# Patient Record
Sex: Male | Born: 1950
Health system: Southern US, Community
[De-identification: ages and names within clinical notes are randomized; demographics above are authoritative.]

## PROBLEM LIST (undated history)

## (undated) DIAGNOSIS — L719 Rosacea, unspecified: Secondary | ICD-10-CM

## (undated) DIAGNOSIS — N529 Male erectile dysfunction, unspecified: Secondary | ICD-10-CM

## (undated) DIAGNOSIS — F3289 Other specified depressive episodes: Secondary | ICD-10-CM

## (undated) DIAGNOSIS — N318 Other neuromuscular dysfunction of bladder: Secondary | ICD-10-CM

## (undated) DIAGNOSIS — F329 Major depressive disorder, single episode, unspecified: Secondary | ICD-10-CM

## (undated) DIAGNOSIS — I10 Essential (primary) hypertension: Secondary | ICD-10-CM

## (undated) DIAGNOSIS — R300 Dysuria: Secondary | ICD-10-CM

## (undated) DIAGNOSIS — E785 Hyperlipidemia, unspecified: Secondary | ICD-10-CM

## (undated) DIAGNOSIS — M1990 Unspecified osteoarthritis, unspecified site: Secondary | ICD-10-CM

## (undated) DIAGNOSIS — L309 Dermatitis, unspecified: Secondary | ICD-10-CM

## (undated) DIAGNOSIS — IMO0002 Reserved for concepts with insufficient information to code with codable children: Secondary | ICD-10-CM

## (undated) DIAGNOSIS — G4730 Sleep apnea, unspecified: Secondary | ICD-10-CM

## (undated) HISTORY — DX: Dermatitis, unspecified: L30.9

## (undated) HISTORY — DX: Reserved for concepts with insufficient information to code with codable children: IMO0002

## (undated) HISTORY — DX: Rosacea, unspecified: L71.9

## (undated) HISTORY — DX: Dysuria: R30.0

## (undated) HISTORY — DX: Essential (primary) hypertension: I10

## (undated) HISTORY — DX: Male erectile dysfunction, unspecified: N52.9

## (undated) HISTORY — DX: Sleep apnea, unspecified: G47.30

## (undated) HISTORY — DX: Hyperlipidemia, unspecified: E78.5

## (undated) HISTORY — DX: Other specified depressive episodes: F32.89

## (undated) HISTORY — DX: Major depressive disorder, single episode, unspecified: F32.9

## (undated) HISTORY — DX: Unspecified osteoarthritis, unspecified site: M19.90

## (undated) HISTORY — DX: Other neuromuscular dysfunction of bladder: N31.8

---

## 1997-09-30 ENCOUNTER — Emergency Department (HOSPITAL_COMMUNITY): Admission: EM | Admit: 1997-09-30 | Discharge: 1997-09-30 | Payer: Self-pay | Admitting: Emergency Medicine

## 1997-10-01 ENCOUNTER — Encounter (HOSPITAL_COMMUNITY): Admission: RE | Admit: 1997-10-01 | Discharge: 1997-12-30 | Payer: Self-pay | Admitting: *Deleted

## 1997-10-15 ENCOUNTER — Inpatient Hospital Stay (HOSPITAL_COMMUNITY): Admission: EM | Admit: 1997-10-15 | Discharge: 1997-10-22 | Payer: Self-pay | Admitting: *Deleted

## 1999-10-10 ENCOUNTER — Ambulatory Visit (HOSPITAL_COMMUNITY): Admission: RE | Admit: 1999-10-10 | Discharge: 1999-10-10 | Payer: Self-pay | Admitting: Family Medicine

## 1999-10-10 ENCOUNTER — Encounter: Payer: Self-pay | Admitting: Family Medicine

## 2000-12-08 ENCOUNTER — Encounter: Admission: RE | Admit: 2000-12-08 | Discharge: 2000-12-08 | Payer: Self-pay | Admitting: Otolaryngology

## 2000-12-08 ENCOUNTER — Encounter: Payer: Self-pay | Admitting: Otolaryngology

## 2000-12-27 ENCOUNTER — Other Ambulatory Visit: Admission: RE | Admit: 2000-12-27 | Discharge: 2000-12-27 | Payer: Self-pay | Admitting: Urology

## 2001-06-27 ENCOUNTER — Other Ambulatory Visit: Admission: RE | Admit: 2001-06-27 | Discharge: 2001-06-27 | Payer: Self-pay | Admitting: Urology

## 2004-02-18 ENCOUNTER — Encounter: Payer: Self-pay | Admitting: Gastroenterology

## 2008-06-14 ENCOUNTER — Ambulatory Visit: Payer: Self-pay | Admitting: Gastroenterology

## 2008-06-14 DIAGNOSIS — R141 Gas pain: Secondary | ICD-10-CM | POA: Insufficient documentation

## 2008-06-14 DIAGNOSIS — Z860100 Personal history of colon polyps, unspecified: Secondary | ICD-10-CM | POA: Insufficient documentation

## 2008-06-14 DIAGNOSIS — R142 Eructation: Secondary | ICD-10-CM

## 2008-06-14 DIAGNOSIS — K921 Melena: Secondary | ICD-10-CM | POA: Insufficient documentation

## 2008-06-14 DIAGNOSIS — R143 Flatulence: Secondary | ICD-10-CM

## 2008-07-02 ENCOUNTER — Ambulatory Visit: Payer: Self-pay | Admitting: Gastroenterology

## 2008-07-02 ENCOUNTER — Encounter: Payer: Self-pay | Admitting: Gastroenterology

## 2008-07-04 ENCOUNTER — Encounter: Payer: Self-pay | Admitting: Gastroenterology

## 2009-04-06 ENCOUNTER — Ambulatory Visit (HOSPITAL_BASED_OUTPATIENT_CLINIC_OR_DEPARTMENT_OTHER): Admission: RE | Admit: 2009-04-06 | Discharge: 2009-04-06 | Payer: Self-pay | Admitting: Obstetrics and Gynecology

## 2009-04-28 ENCOUNTER — Ambulatory Visit: Payer: Self-pay | Admitting: Internal Medicine

## 2010-09-24 ENCOUNTER — Encounter: Payer: Self-pay | Admitting: Internal Medicine

## 2010-09-26 ENCOUNTER — Institutional Professional Consult (permissible substitution): Payer: 59 | Admitting: Internal Medicine

## 2010-09-30 ENCOUNTER — Ambulatory Visit (INDEPENDENT_AMBULATORY_CARE_PROVIDER_SITE_OTHER): Payer: 59 | Admitting: Internal Medicine

## 2010-09-30 ENCOUNTER — Encounter: Payer: Self-pay | Admitting: Internal Medicine

## 2010-09-30 VITALS — BP 108/64 | HR 57 | Ht 71.0 in | Wt 244.6 lb

## 2010-09-30 DIAGNOSIS — G4733 Obstructive sleep apnea (adult) (pediatric): Secondary | ICD-10-CM

## 2010-09-30 DIAGNOSIS — G4739 Other sleep apnea: Secondary | ICD-10-CM

## 2010-09-30 MED ORDER — ZALEPLON 10 MG PO CAPS: 10.0000 mg | ORAL_CAPSULE | Freq: Every day | ORAL | Status: AC

## 2010-09-30 NOTE — Progress Notes (Signed)
  Subjective:    Patient ID: Daniel Frank, male    DOB: 05-15-1951, 60 y.o.   MRN: [id number]  HPI 75 yoM self referred for sleep complaints. He has a long-standing adult pattern of difficulty maintianing sleep, but falls asleep ok. He wakes every hour or two, sometimes for bathroom, but usually not clear why. Nocturia x 2-3. Urologist "irritable bladder". Disliked his only try with unknown sleep med. NPSG 04/06/09 showed severe obstructive and central sleep apnea, AHI 79.8/ hr. CPAP titration to 19 cwp left residual central apneas at 12.9/hr. He weighed 260 lbs at that time. Initial CPAP at 19 was intolerably high. Changed to autoPAP 6-19. Using a comfortable full face mask and dieted off 22 lbs. He isn't sleeping through the night much better than before. Lives with mother- not told of snore through. Not aware of movement disturbance or other discomforts. No naps, little caffeine. Review of Systems See HPI Constitutional:   No weight loss, night sweats,  Fevers, chills, fatigue,HEENT:   No headaches,  Difficulty swallowing,  Tooth/dental problems,  Sore throat,                No sneezing, itching, ear ache, nasal congestion, post nasal drip,   CV:  No chest pain, orthopnea, PND, swelling in lower extremities, anasarca, dizziness, palpitations  GI  No heartburn, indigestion, abdominal pain, nausea, vomiting, diarrhea, change in bowel habits, loss of appetite  Resp: No shortness of breath with exertion or at rest.  No excess mucus, no productive cough,  No non-productive cough,  No coughing up of blood.  No change in color of mucus.  No wheezing.  Skin: no rash or lesions.  GU: no dysuria, change in color of urine, no urgency or frequency.  No flank pain.  MS:  No joint pain or swelling.  No decreased range of motion.  No back pain.  Psych:  No change in mood or affect. No depression or anxiety.  No memory loss.      Objective:   Physical Exam General- Alert, Oriented, Affect-appropriate,  Distress- none acute   overweight  Skin- rash-none, lesions- none, excoriation- none    dry  Lymphadenopathy- none  Head- atraumatic  Eyes- Gross vision intact, PERRLA, conjunctivae clear, secretions  Ears- Normal- Hearing, canals, Tm L ,   R ,  Nose- Clear, No- Septal dev, mucus, polyps, erosion, perforation   Throat- Mallampati III , mucosa clear , drainage- none, tonsils- atrophic  Neck- flexible , trachea midline, no stridor , thyroid nl, carotid no bruit  Chest - symmetrical excursion , unlabored     Heart/CV- RRR , no murmur , no gallop  , no rub, nl s1 s2                     - JVD- none , edema- none, stasis changes- none, varices- none     Lung- clear to P&A, wheeze- none, cough- none , dullness-none, rub- none     Chest wall- Abd- tender-no, distended-no, bowel sounds-present, HSM- no  Br/ Gen/ Rectal- Not done, not indicated  Extrem- cyanosis- none, clubbing, none, atrophy- none, strength- nl  Neuro- grossly intact to observation         Assessment & Plan:

## 2010-09-30 NOTE — Patient Instructions (Signed)
Order- [facility]- autotitrate 5-20 cwp x 2 weeks for pressure check.    Try script generic Remus Loffler during the time the autotitration is being done.

## 2010-10-02 ENCOUNTER — Encounter: Payer: Self-pay | Admitting: Internal Medicine

## 2010-10-02 DIAGNOSIS — G4739 Other sleep apnea: Secondary | ICD-10-CM | POA: Insufficient documentation

## 2010-10-02 NOTE — Assessment & Plan Note (Signed)
Residual waking may reflect the central apnea component. CSA can be harder to treat, not responsive to CPAP, and possibly due to impaired central nervous system response to changing cerebral oxygenation/ slowed feedback loop. We will get a new CPAP download with his autopap for pressure and control check. Meanwhile he can try to consolidate sleep with ambien.

## 2010-10-13 ENCOUNTER — Encounter: Payer: Self-pay | Admitting: Internal Medicine

## 2010-11-06 ENCOUNTER — Ambulatory Visit (INDEPENDENT_AMBULATORY_CARE_PROVIDER_SITE_OTHER): Payer: 59 | Admitting: Internal Medicine

## 2010-11-06 ENCOUNTER — Encounter: Payer: Self-pay | Admitting: Internal Medicine

## 2010-11-06 VITALS — BP 118/78 | HR 71 | Ht 71.0 in | Wt 245.0 lb

## 2010-11-06 DIAGNOSIS — G4739 Other sleep apnea: Secondary | ICD-10-CM

## 2010-11-06 NOTE — Progress Notes (Signed)
Subjective:    Patient ID: Daniel Frank, male    DOB: 1951/03/19, 60 y.o.   MRN: [id number]  HPI  Subjective:    Patient ID: Daniel Frank, male    DOB: 20-Nov-1950, 60 y.o.   MRN: [id number]  HPI 09/30/10- 50 yoM self referred for sleep complaints. He has a long-standing adult pattern of difficulty maintianing sleep, but falls asleep ok. He wakes every hour or two, sometimes for bathroom, but usually not clear why. Nocturia x 2-3. Urologist "irritable bladder". Disliked his only try with unknown sleep med. NPSG 04/06/09 showed severe obstructive and central sleep apnea, AHI 79.8/ hr. CPAP titration to 19 cwp left residual central apneas at 12.9/hr. He weighed 260 lbs at that time. Initial CPAP at 19 was intolerably high. Changed to autoPAP 6-19. Using a comfortable full face mask and dieted off 22 lbs. He isn't sleeping through the night much better than before. Lives with mother- not told of snore through. Not aware of movement disturbance or other discomforts. No naps, little caffeine.  11/06/10- OSA , nocturia/ irritable bladder per Urology Download indicates low compliance, because he gets tired of taking it off over and over for bathroom trips. We will try once more with fixed pressure. Ambien gave headache. We will try lunesta samples. Ultimately, success with CPAP will be controlled by ability of Urology to manage his irritable bladder. We discussed oral appliances as alternative to CPAP.   Review of Systems See HPI Constitutional:   No weight loss, night sweats,  Fevers, chills, fatigue,HEENT:   No headaches,  Difficulty swallowing,  Tooth/dental problems,  Sore throat,                No sneezing, itching, ear ache, nasal congestion, post nasal drip,   CV:  No chest pain, orthopnea, PND, swelling in lower extremities, anasarca, dizziness, palpitations  GI  No heartburn, indigestion, abdominal pain, nausea, vomiting, diarrhea, change in bowel habits, loss of appetite  Resp: No  shortness of breath with exertion or at rest.  No excess mucus, no productive cough,  No non-productive cough,  No coughing up of blood.  No change in color of mucus.  No wheezing.  Skin: no rash or lesions.  GU: no dysuria, change in color of urine,.  No flank pain.  MS:  No joint pain or swelling.  No decreased range of motion.  No back pain.  Psych:  No change in mood or affect. No depression or anxiety.  No memory loss.      Objective:   Physical Exam General- Alert, Oriented, Affect-appropriate, Distress- none acute.   overweight  Skin- rash-none, lesions- none, excoriation- none    dry  Lymphadenopathy- none  Head- atraumatic  Eyes- Gross vision intact, PERRLA, conjunctivae clear, secretions  Ears- Normal- Hearing, canals, Tm -  normal  Nose- Clear, No- Septal dev, mucus, polyps, erosion, perforation   Throat- Mallampati III , mucosa clear , drainage- none, tonsils- atrophic  Neck- flexible , trachea midline, no stridor , thyroid nl, carotid no bruit  Chest - symmetrical excursion , unlabored     Heart/CV- RRR , no murmur , no gallop  , no rub, nl s1 s2                     - JVD- none , edema- none, stasis changes- none, varices- none     Lung- clear to P&A, wheeze- none, cough- none , dullness-none, rub- none     Chest  wall- Abd- tender-no, distended-no, bowel sounds-present, HSM- no  Br/ Gen/ Rectal- Not done, not indicated  Extrem- cyanosis- none, clubbing, none, atrophy- none, strength- nl  Neuro- grossly intact to observation         Assessment & Plan:     Review of Systems     Objective:   Physical Exam        Assessment & Plan:

## 2010-11-06 NOTE — Assessment & Plan Note (Addendum)
Need to get better compliance and control. If his medical bladder problem remains disruptive, then we will try an oral appliance next.  I will set him at 11 cwp fixed CPAP through [facility], and switch from [place] which gave headache, to try Lunesta samples.

## 2010-11-06 NOTE — Patient Instructions (Signed)
Replace ambien with samples Lunesta 2 mg- 1 at bedtime for sleep as needed. Call for script if helpful.  [facility] to change CPAP to fixed 11 cwp                          Our CPAP goal is all night, every night.       Try disconnecting at the hose when you get up

## 2010-11-07 ENCOUNTER — Telehealth: Payer: Self-pay | Admitting: Internal Medicine

## 2010-11-07 NOTE — Telephone Encounter (Signed)
[email or web address]  With pt's mother at the [facility] number. Pt had already left work for the day.

## 2010-11-09 ENCOUNTER — Encounter: Payer: Self-pay | Admitting: Internal Medicine

## 2010-11-10 NOTE — Telephone Encounter (Signed)
Spoke with patient-states he was wondering if another Download was needed since CPAP pressure was increased; Pt aware no download needed but to keep me posted how pressure works for him and keep 6 week follow up visit with CY.

## 2010-12-18 ENCOUNTER — Encounter: Payer: Self-pay | Admitting: Internal Medicine

## 2010-12-18 ENCOUNTER — Ambulatory Visit (INDEPENDENT_AMBULATORY_CARE_PROVIDER_SITE_OTHER): Payer: 59 | Admitting: Internal Medicine

## 2010-12-18 VITALS — BP 100/64 | HR 65 | Ht 71.0 in | Wt 245.6 lb

## 2010-12-18 DIAGNOSIS — R351 Nocturia: Secondary | ICD-10-CM

## 2010-12-18 DIAGNOSIS — G4739 Other sleep apnea: Secondary | ICD-10-CM

## 2010-12-18 MED ORDER — TEMAZEPAM 15 MG PO CAPS: ORAL_CAPSULE | ORAL | Status: DC

## 2010-12-18 NOTE — Patient Instructions (Signed)
Ask  Christoper Allegra how to adjust the "Ramp" feature on your CPAP  Script- try temazepam for sleep- script

## 2010-12-18 NOTE — Progress Notes (Signed)
Subjective:    Patient ID: Daniel Frank, male    DOB: 1950-10-26, 60 y.o.   MRN: [id number]  HPI    Review of Systems     Objective:   Physical Exam        Assessment & Plan:   Subjective:    Patient ID: Daniel Frank, male    DOB: 10/05/1950, 60 y.o.   MRN: [id number]  HPI  Subjective:    Patient ID: Daniel Frank, male    DOB: Feb 02, 1951, 60 y.o.   MRN: [id number]  HPI 09/30/10- 16 yoM self referred for sleep complaints. He has a long-standing adult pattern of difficulty maintianing sleep, but falls asleep ok. He wakes every hour or two, sometimes for bathroom, but usually not clear why. Nocturia x 2-3. Urologist "irritable bladder". Disliked his only try with unknown sleep med. NPSG 04/06/09 showed severe obstructive and central sleep apnea, AHI 79.8/ hr. CPAP titration to 19 cwp left residual central apneas at 12.9/hr. He weighed 260 lbs at that time. Initial CPAP at 19 was intolerably high. Changed to autoPAP 6-19. Using a comfortable full face mask and dieted off 22 lbs. He isn't sleeping through the night much better than before. Lives with mother- not told of snore through. Not aware of movement disturbance or other discomforts. No naps, little caffeine.  11/06/10- OSA , nocturia/ irritable bladder per Urology Download indicates low compliance, because he gets tired of taking it off over and over for bathroom trips. We will try once more with fixed pressure. Ambien gave headache. We will try lunesta samples. Ultimately, success with CPAP will be controlled by ability of Urology to manage his irritable bladder. We discussed oral appliances as alternative to CPAP.   12/18/10- OSA , nocturia/ irritable bladder per Urology We had set him on CPAP 11. He is using it less. We replaced ambien with Lunesta which lasts too long. Nocturia is a chronic problem that he continues to work on with his urologist, doing exercises. He can sleep 2 hours w/o mask before needing to get up to  urinate, and feels he sleeps as long and as well w/o CPAP. When he wakes he occasionally turns his TV on.   Review of Systems See HPI Constitutional:   No weight loss, night sweats,  Fevers, chills, fatigue,HEENT:   No headaches,  Difficulty swallowing,  Tooth/dental problems,  Sore throat,                No sneezing, itching, ear ache, nasal congestion, post nasal drip,   CV:  No chest pain, orthopnea, PND, swelling in lower extremities, anasarca, dizziness, palpitations  GI  No heartburn, indigestion, abdominal pain, nausea, vomiting, diarrhea, change in bowel habits, loss of appetite  Resp: No shortness of breath with exertion or at rest.  No excess mucus, no productive cough,  No non-productive cough,  No coughing up of blood.  No change in color of mucus.  No wheezing.  Skin: no rash or lesions.  GU: no dysuria, change in color of urine,.  No flank pain.  MS:  No joint pain or swelling.  No decreased range of motion.  No back pain.  Psych:  No change in mood or affect. No depression or anxiety.  No memory loss.      Objective:   Physical Exam General- Alert, Oriented, Affect-appropriate, Distress- none acute.   overweight  Skin- rash-none, lesions- none, excoriation- none    dry  Lymphadenopathy- none  Head- atraumatic  Eyes-  Gross vision intact, PERRLA, conjunctivae clear, secretions  Ears- Normal- Hearing, canals, Tm -  normal  Nose- Clear, No- Septal dev, mucus, polyps, erosion, perforation   Throat- Mallampati III , mucosa clear , drainage- none, tonsils- atrophic  Neck- flexible , trachea midline, no stridor , thyroid nl, carotid no bruit  Chest - symmetrical excursion , unlabored     Heart/CV- RRR , no murmur , no gallop  , no rub, nl s1 s2                     - JVD- none , edema- none, stasis changes- none, varices- none     Lung- clear to P&A, wheeze- none, cough- none , dullness-none, rub- none     Chest wall- Abd- tender-no, distended-no, bowel  sounds-present, HSM- no  Br/ Gen/ Rectal- Not done, not indicated  Extrem- cyanosis- none, clubbing, none, atrophy- none, strength- nl  Neuro- grossly intact to observation         Assessment & Plan:     Review of Systems     Objective:   Physical Exam        Assessment & Plan:

## 2010-12-18 NOTE — Assessment & Plan Note (Addendum)
Pressure of 11 seems right. He needs help with the ramp feature and will call his DME. Sleep hygeine and TV habit were discussed. His nocturia is a problem and I told him that we may end up steering him toward an oral appliance so that he doesn't have to keep connecting and disconnecting.

## 2010-12-21 DIAGNOSIS — R351 Nocturia: Secondary | ICD-10-CM | POA: Insufficient documentation

## 2011-02-17 ENCOUNTER — Encounter: Payer: Self-pay | Admitting: Internal Medicine

## 2011-02-17 ENCOUNTER — Ambulatory Visit (INDEPENDENT_AMBULATORY_CARE_PROVIDER_SITE_OTHER): Payer: 59 | Admitting: Internal Medicine

## 2011-02-17 VITALS — BP 128/76 | HR 63 | Ht 71.0 in | Wt 240.8 lb

## 2011-02-17 DIAGNOSIS — G4739 Other sleep apnea: Secondary | ICD-10-CM

## 2011-02-17 MED ORDER — ZALEPLON 10 MG PO CAPS: 10.0000 mg | ORAL_CAPSULE | Freq: Every day | ORAL | Status: AC

## 2011-02-17 NOTE — Patient Instructions (Signed)
Order- [facility] referral to Dr Althea Grimmer for consideration of oral appliance for Sleep Apnea  Script to try sonata for sleep

## 2011-02-17 NOTE — Progress Notes (Signed)
Subjective:    Patient ID: Daniel Frank, male    DOB: [birth year], 60 y.o.   MRN: [id number]  HPI    Review of Systems     Objective:   Physical Exam        Assessment & Plan:   Subjective:    Patient ID: Daniel Frank, male    DOB: 1950/10/27, 60 y.o.   MRN: [id number]  HPI    Review of Systems     Objective:   Physical Exam        Assessment & Plan:   Subjective:    Patient ID: Daniel Frank, male    DOB: Sep 21, 1950, 60 y.o.   MRN: [id number]  HPI  Subjective:    Patient ID: Daniel Frank, male    DOB: 1950/08/23, 60 y.o.   MRN: [id number]  HPI 09/30/10- 38 yoM self referred for sleep complaints. He has a long-standing adult pattern of difficulty maintianing sleep, but falls asleep ok. He wakes every hour or two, sometimes for bathroom, but usually not clear why. Nocturia x 2-3. Urologist "irritable bladder". Disliked his only try with unknown sleep med. NPSG 04/06/09 showed severe obstructive and central sleep apnea, AHI 79.8/ hr. CPAP titration to 19 cwp left residual central apneas at 12.9/hr. He weighed 260 lbs at that time. Initial CPAP at 19 was intolerably high. Changed to autoPAP 6-19. Using a comfortable full face mask and dieted off 22 lbs. He isn't sleeping through the night much better than before. Lives with mother- not told of snore through. Not aware of movement disturbance or other discomforts. No naps, little caffeine.  11/06/10- OSA , nocturia/ irritable bladder per Urology Download indicates low compliance, because he gets tired of taking it off over and over for bathroom trips. We will try once more with fixed pressure. Ambien gave headache. We will try lunesta samples. Ultimately, success with CPAP will be controlled by ability of Urology to manage his irritable bladder. We discussed oral appliances as alternative to CPAP.   12/18/10- OSA , nocturia/ irritable bladder per Urology We had set him on CPAP 11. He is using it less. We replaced  ambien with Lunesta which lasts too long. Nocturia is a chronic problem that he continues to work on with his urologist, doing exercises. He can sleep 2 hours w/o mask before needing to get up to urinate, and feels he sleeps as long and as well w/o CPAP. When he wakes he occasionally turns his TV on.   02/17/11- 59 yoM followed for Insomnia with OSA , complicated by  nocturia/ irritable bladder per Urology Temazepam helped but on 3 trials gave headache. Still wakes every hour or so for bladder- disturbing CPAP.He admits he needs to do a better job of practicing his pelvic floor muscle exercises.  New family doctor Dr Modesto Charon advised weight loss. Nasal airway tends to be stuffy- nasal strips help some. He just finds CPAP hard to put up with, especially when he is up and down so much.  We discussed oral appliance therapy as an option.  Review of Systems See HPI Constitutional:   No weight loss, night sweats,  Fevers, chills, fatigue,HEENT:   No headaches,  Difficulty swallowing,  Tooth/dental problems,  Sore throat,                No sneezing, itching, ear ache, nasal congestion, post nasal drip,  CV:  No chest pain, orthopnea, PND, swelling in lower extremities, anasarca, dizziness, palpitations  GI  No heartburn, indigestion, abdominal pain, nausea, vomiting, diarrhea, change in bowel habits, loss of appetite Resp: No shortness of breath with exertion or at rest.  No excess mucus, no productive cough,  No non-productive cough,  No coughing up of blood.  No change in color of mucus.  No wheezing.  Skin: no rash or lesions. GU: no dysuria, change in color of urine,.  No flank pain.  + chronic urinary urgency MS:  No joint pain or swelling.  No decreased range of motion.  No back pain. Psych:  No change in mood or affect. No depression or anxiety.  No memory loss.      Objective:   Physical Exam  General- Alert, Oriented, Affect-appropriate, Distress- none acute; overweight Skin- rash-none,  lesions- none, excoriation- none Lymphadenopathy- none Head- atraumatic            Eyes- Gross vision intact, PERRLA, conjunctivae clear secretions            Ears- Hearing, canalsnormal            Nose- Clear, No-Septal dev, mucus, polyps, erosion, perforation             Throat- Mallampati III , mucosa clear , drainage- none, tonsils- atrophic Neck- flexible , trachea midline, no stridor , thyroid nl, carotid no bruit Chest - symmetrical excursion , unlabored           Heart/CV- RRR , no murmur , no gallop  , no rub, nl s1 s2                           - JVD- none , edema- none, stasis changes- none, varices- none           Lung- clear to P&A, wheeze- none, cough- none , dullness-none, rub- none           Chest wall-  Abd- tender-no, distended-no, bowel sounds-present, HSM- no Br/ Gen/ Rectal- Not done, not indicated Extrem- cyanosis- none, clubbing, none, atrophy- none, strength- nl Neuro- grossly intact to observation           Assessment & Plan:     Review of Systems     Objective:   Physical Exam        Assessment & Plan:

## 2011-02-17 NOTE — Assessment & Plan Note (Addendum)
He has had difficulty maintaining compliance with CPAP 11/ Advanced, because bladder discomfort requires repeated trips to bathroom during the night. He would sleep better if his bladder woke him less. Weight loss and resumption of bladder exercises would help. He has being trying CPAP but never adapted well.  Plan refer for consideration of oral appliance.

## 2011-04-24 ENCOUNTER — Encounter (INDEPENDENT_AMBULATORY_CARE_PROVIDER_SITE_OTHER): Payer: Self-pay | Admitting: General Surgery

## 2011-04-24 ENCOUNTER — Ambulatory Visit (INDEPENDENT_AMBULATORY_CARE_PROVIDER_SITE_OTHER): Payer: 59 | Admitting: General Surgery

## 2011-04-24 VITALS — BP 128/76 | HR 68 | Temp 97.4°F | Resp 16 | Ht 71.0 in | Wt 239.6 lb

## 2011-04-24 DIAGNOSIS — E669 Obesity, unspecified: Secondary | ICD-10-CM

## 2011-04-24 DIAGNOSIS — N62 Hypertrophy of breast: Secondary | ICD-10-CM

## 2011-04-24 NOTE — Progress Notes (Signed)
Subjective:     Patient ID: Daniel Frank, male   DOB: [birth year], 60 y.o.   MRN: [id number]  HPIMr. Daniel Frank was referred to Dr. Gladis Riffle for slight thickening in the right breast area that was even a lipoma or possibly a little area of gynecomastia. Patient states he noticed area when he was showering he used to weigh about 265 pounds and he has lost approximately 240 and he is still markedly overweight on findings of this area Dr. Jacqulyn Bath referred him for a mammogram that was done in each but it was read by Alva Garnet K. pull up the actual images but she's not see any findings suspicious for malignancy and consider this possibly a little lipoma or possibly a little fatty lobule of breast tissue on physical exam when you examine him he's got very minimal gynecomastia both the left and right but is not really symptomatic is not tender at the nipples and I did review his various medications he is on some antihypertensive medication and antidepressants but none although more reported to be associated with gynecomastia but I can identify   Review of SystemsHistory reviewed. No pertinent past surgical history. No Known Allergies Current Outpatient Prescriptions  Medication Sig Dispense Refill  . Cholecalciferol (VITAMIN D) 2000 UNITS CAPS Take 1 capsule by mouth daily.        Marland Kitchen LOVAZA 1 G capsule Take 1 capsule by mouth 2 (two) times daily.       . Multiple Vitamin (MULTIVITAMIN) tablet Take 1 tablet by mouth daily.        . niacin-simvastatin (SIMCOR) 1000-20 MG 24 hr tablet Take 1 tablet by mouth at bedtime.        Marland Kitchen olmesartan (BENICAR) 40 MG tablet Take 40 mg by mouth daily.        . paroxetine mesylate (PEXEVA) 10 MG tablet Take 10 mg by mouth every morning.              Objective:   Physical ExamBP 128/76  Pulse 68  Temp(Src) 97.4 F (36.3 C) (Temporal)  Resp 16  Ht 5\' 11"  (1.803 m)  Wt 239 lb 9.6 oz (108.682 kg)  BMI 33.42 kg/m2  Patient is a large individual and when a stent in his data  obvious normal breast to assure than thin individual would have but is not the obvious thickening significant gynecomastia he doesn't report that his nipples are sensitive on physical exam when lying flat arms height you can feel a little breast is sure about probably to 3 cm total size but there is nothing that feels worrisome for malignancy or an obvious lipoma fever I don't appreciate any axillary lymphadenopathy the left or right and I did reassure him that I would just continue trying to lose weight he has lost 25 times N. not be too concerned about this unless the area that he had noted increases in size there was shortening no radiological suspicion on the mammogram but he had reviewed with him to the hospital and taken through a computer that can pull it up on. The patient is aware of the area who first noticed an insidious increase in size so that's a nickel quarter-sized I. he could return to be recently and a one of my partners I would not recommend doing anything at this time since the area of question as very small    Assessment:    Continue trying to lose weight and at the area of that he is observed is becoming  larger he can return to see one of my partners with a negative mammogram and such a minimal final physical exam at this time I would not recommend any plan of excision.     Plan:     See note above

## 2011-05-28 ENCOUNTER — Ambulatory Visit (INDEPENDENT_AMBULATORY_CARE_PROVIDER_SITE_OTHER): Payer: 59 | Admitting: Internal Medicine

## 2011-05-28 ENCOUNTER — Encounter: Payer: Self-pay | Admitting: Internal Medicine

## 2011-05-28 VITALS — BP 110/64 | HR 77 | Ht 71.0 in | Wt 240.2 lb

## 2011-05-28 DIAGNOSIS — G4739 Other sleep apnea: Secondary | ICD-10-CM

## 2011-05-28 DIAGNOSIS — Z23 Encounter for immunization: Secondary | ICD-10-CM

## 2011-05-28 NOTE — Patient Instructions (Signed)
I am glad things are doing better with CPAP. Please let me know if something needs attention.

## 2011-05-28 NOTE — Progress Notes (Signed)
Patient ID: Daniel Frank, male    DOB: 06-23-1950, 60 y.o.   MRN: [id number]  HPI 09/30/10- 89 yoM self referred for sleep complaints. He has a long-standing adult pattern of difficulty maintianing sleep, but falls asleep ok. He wakes every hour or two, sometimes for bathroom, but usually not clear why. Nocturia x 2-3. Urologist "irritable bladder". Disliked his only try with unknown sleep med. NPSG 04/06/09 showed severe obstructive and central sleep apnea, AHI 79.8/ hr. CPAP titration to 19 cwp left residual central apneas at 12.9/hr. He weighed 260 lbs at that time. Initial CPAP at 19 was intolerably high. Changed to autoPAP 6-19. Using a comfortable full face mask and dieted off 22 lbs. He isn't sleeping through the night much better than before. Lives with mother- not told of snore through. Not aware of movement disturbance or other discomforts. No naps, little caffeine.  11/06/10- OSA , nocturia/ irritable bladder per Urology Download indicates low compliance, because he gets tired of taking it off over and over for bathroom trips. We will try once more with fixed pressure. Ambien gave headache. We will try lunesta samples. Ultimately, success with CPAP will be controlled by ability of Urology to manage his irritable bladder. We discussed oral appliances as alternative to CPAP.   12/18/10- OSA , nocturia/ irritable bladder per Urology We had set him on CPAP 11. He is using it less. We replaced ambien with Lunesta which lasts too long. Nocturia is a chronic problem that he continues to work on with his urologist, doing exercises. He can sleep 2 hours w/o mask before needing to get up to urinate, and feels he sleeps as long and as well w/o CPAP. When he wakes he occasionally turns his TV on.   02/17/11- 60 yoM followed for Insomnia with OSA , complicated by  nocturia/ irritable bladder per Urology Temazepam helped but on 3 trials gave headache. Still wakes every hour or so for bladder- disturbing  CPAP.He admits he needs to do a better job of practicing his pelvic floor muscle exercises.  New family doctor Dr Modesto Charon advised weight loss. Nasal airway tends to be stuffy- nasal strips help some. He just finds CPAP hard to put up with, especially when he is up and down so much.  We discussed oral appliance therapy as an option.  05/28/11- 60 yoM followed for Insomnia with OSA , complicated by  nocturia/ irritable bladder per Urology Declines flu vax. Says he has become able to tolerate CPAP better and is now using it all night every night. Using bladder/ pelvic exercises to reduce nocturia related waking. Not needing Sonata. Recent sinus congestion/ headache. Dr Modesto Charon is treating abx, sudafed,- medication talk done.  Review of Systems See HPI Constitutional:   No weight loss, night sweats,  Fevers, chills, fatigue, HEENT:   No headaches,  Difficulty swallowing,  Tooth/dental problems,  Sore throat,                No sneezing, itching, ear ache, +nasal congestion, post nasal drip,  CV:  No chest pain, orthopnea, PND, swelling in lower extremities, anasarca, dizziness, palpitations GI  No heartburn, indigestion, abdominal pain, nausea, vomiting, diarrhea, change in bowel habits, loss of appetite Resp: No shortness of breath with exertion or at rest.  No excess mucus, no productive cough,  No non-productive cough,  No coughing up of blood.  No change in color of mucus.  No wheezing.  Skin: no rash or lesions. GU: no dysuria, change in  color of urine,.  No flank pain.  + chronic urinary urgency MS:  No joint pain or swelling.  No decreased range of motion.  No back pain. Psych:  No change in mood or affect. No depression or anxiety.  No memory loss.      Objective:   Physical Exam  General- Alert, Oriented, Affect-appropriate, Distress- none acute; overweight Skin- rash-none, lesions- none, excoriation- none Lymphadenopathy- none Head- atraumatic            Eyes- Gross vision intact,  PERRLA, conjunctivae clear secretions            Ears- Hearing, canalsnormal            Nose- mild turbinate edema, No-Septal dev, mucus, polyps, erosion, perforation             Throat- Mallampati III-IV, mucosa clear , drainage- none, tonsils- atrophic Neck- flexible , trachea midline, no stridor , thyroid nl, carotid no bruit Chest - symmetrical excursion , unlabored           Heart/CV- RRR , no murmur , no gallop  , no rub, nl s1 s2                           - JVD- none , edema- none, stasis changes- none, varices- none           Lung- clear to P&A, wheeze- none, cough- none , dullness-none, rub- none           Chest wall-  Abd- tender-no, distended-no, bowel sounds-present, HSM- no Br/ Gen/ Rectal- Not done, not indicated Extrem- cyanosis- none, clubbing, none, atrophy- none, strength- nl Neuro- grossly intact to observation

## 2011-06-01 NOTE — Assessment & Plan Note (Signed)
More hopeful that he can make CPAP work. He is controlling nocturia better, meaning fewer times waking and removing mask for bathroom.

## 2011-07-02 ENCOUNTER — Encounter (INDEPENDENT_AMBULATORY_CARE_PROVIDER_SITE_OTHER): Payer: Self-pay

## 2011-07-09 ENCOUNTER — Encounter: Payer: Self-pay | Admitting: Gastroenterology

## 2011-07-28 ENCOUNTER — Encounter: Payer: Self-pay | Admitting: Gastroenterology

## 2011-07-28 ENCOUNTER — Ambulatory Visit (AMBULATORY_SURGERY_CENTER): Payer: 59 | Admitting: *Deleted

## 2011-07-28 VITALS — Ht 71.0 in | Wt 240.5 lb

## 2011-07-28 DIAGNOSIS — Z1211 Encounter for screening for malignant neoplasm of colon: Secondary | ICD-10-CM

## 2011-07-28 MED ORDER — PEG-KCL-NACL-NASULF-NA ASC-C 100 G PO SOLR: ORAL | Status: DC

## 2011-08-11 ENCOUNTER — Ambulatory Visit (AMBULATORY_SURGERY_CENTER): Payer: 59 | Admitting: Gastroenterology

## 2011-08-11 ENCOUNTER — Encounter: Payer: Self-pay | Admitting: Gastroenterology

## 2011-08-11 VITALS — BP 113/79 | HR 66 | Temp 95.0°F | Resp 19 | Ht 71.0 in | Wt 240.0 lb

## 2011-08-11 DIAGNOSIS — Z86010 Personal history of colonic polyps: Secondary | ICD-10-CM

## 2011-08-11 DIAGNOSIS — D126 Benign neoplasm of colon, unspecified: Secondary | ICD-10-CM

## 2011-08-11 DIAGNOSIS — Z1211 Encounter for screening for malignant neoplasm of colon: Secondary | ICD-10-CM

## 2011-08-11 DIAGNOSIS — D129 Benign neoplasm of anus and anal canal: Secondary | ICD-10-CM

## 2011-08-11 DIAGNOSIS — D128 Benign neoplasm of rectum: Secondary | ICD-10-CM

## 2011-08-11 LAB — HM COLONOSCOPY

## 2011-08-11 MED ORDER — SODIUM CHLORIDE 0.9 % IV SOLN: 500.0000 mL | INTRAVENOUS | Status: DC

## 2011-08-11 NOTE — Patient Instructions (Signed)

## 2011-08-11 NOTE — Progress Notes (Signed)
Patient did not have preoperative order for IV antibiotic SSI prophylaxis. (G8918)  Patient did not experience any of the following events: a burn prior to discharge; a fall within the facility; wrong site/side/patient/procedure/implant event; or a hospital transfer or hospital admission upon discharge from the facility. (G8907)  

## 2011-08-11 NOTE — Op Note (Signed)
[facility] [address] [place], [place]  [address]  COLONOSCOPY PROCEDURE REPORT  PATIENT:  Daniel, Frank  MR#:  [id number] BIRTHDATE:  [birth year], 60 yrs. old  GENDER:  male ENDOSCOPIST:  Judie Petit T. Russella Dar, MD, [facility]  PROCEDURE DATE:  08/11/2011 PROCEDURE:  Colonoscopy with biopsy ASA CLASS:  Class II INDICATIONS:  1) surveillance and high-risk screening  2) history of pre-cancerous (adenomatous) colon polyps: 2005. MEDICATIONS:   MAC sedation, administered by CRNA, propofol (Diprivan) 200 mg IV DESCRIPTION OF PROCEDURE:   After the risks benefits and alternatives of the procedure were thoroughly explained, informed consent was obtained.  Digital rectal exam was performed and revealed no abnormalities.   The LB CF-[id number] endoscope was introduced through the anus and advanced to the cecum, which was identified by both the appendix and ileocecal valve, with a tortuous colon.   The quality of the prep was adequate, using MoviPrep.  The instrument was then slowly withdrawn as the colon was fully examined. <<PROCEDUREIMAGES>> FINDINGS:  A sessile polyp was found in the ascending colon. It was 5 mm in size. The polyp was removed using cold biopsy forceps. Otherwise normal colonoscopy without other polyps, masses, vascular ectasias, or inflammatory changes. Retroflexed views in the rectum revealed no abnormalities. The time to cecum =  4.33 minutes. The scope was then withdrawn (time =  10.75  min) from the patient and the procedure completed.  COMPLICATIONS:  None  ENDOSCOPIC IMPRESSION: 1) 5 mm sessile polyp in the ascending colon  RECOMMENDATIONS: 1) Await pathology results 2) Repeat Colonoscopy in 5 years with a more extensive bowel prep  Daniel Frank T. Russella Dar, MD, Daniel Frank  CC:  Orvan July MD  n. Rosalie DoctorVenita Lick. Louna Rothgeb at 08/11/2011 09:01 AM  [place], [phone]

## 2011-08-12 ENCOUNTER — Telehealth: Payer: Self-pay | Admitting: *Deleted

## 2011-08-12 NOTE — Telephone Encounter (Signed)
Voicemail message left

## 2011-08-22 ENCOUNTER — Encounter: Payer: Self-pay | Admitting: Gastroenterology

## 2012-04-25 ENCOUNTER — Telehealth: Payer: Self-pay | Admitting: Internal Medicine

## 2012-04-25 NOTE — Telephone Encounter (Signed)
Will forward to Dr. Young as an FYI 

## 2012-04-26 NOTE — Telephone Encounter (Signed)
Noted  

## 2012-05-27 ENCOUNTER — Ambulatory Visit: Payer: 59 | Admitting: Internal Medicine

## 2012-08-25 ENCOUNTER — Other Ambulatory Visit (INDEPENDENT_AMBULATORY_CARE_PROVIDER_SITE_OTHER): Payer: 59

## 2012-08-25 DIAGNOSIS — E785 Hyperlipidemia, unspecified: Secondary | ICD-10-CM

## 2012-08-25 DIAGNOSIS — E559 Vitamin D deficiency, unspecified: Secondary | ICD-10-CM

## 2012-08-25 DIAGNOSIS — I10 Essential (primary) hypertension: Secondary | ICD-10-CM

## 2012-08-25 LAB — COMPREHENSIVE METABOLIC PANEL
ALT: 17 U/L (ref 0–53)
AST: 16 U/L (ref 0–37)
Albumin: 4 g/dL (ref 3.5–5.2)
Alkaline Phosphatase: 54 U/L (ref 39–117)
BUN: 19 mg/dL (ref 6–23)
CO2: 31 mEq/L (ref 19–32)
Calcium: 9 mg/dL (ref 8.4–10.5)
Chloride: 105 mEq/L (ref 96–112)
Creat: 1 mg/dL (ref 0.50–1.35)
Glucose, Bld: 107 mg/dL — ABNORMAL HIGH (ref 70–99)
Potassium: 5 mEq/L (ref 3.5–5.3)
Sodium: 142 mEq/L (ref 135–145)
Total Bilirubin: 0.4 mg/dL (ref 0.3–1.2)
Total Protein: 5.8 g/dL — ABNORMAL LOW (ref 6.0–8.3)

## 2012-08-25 NOTE — Progress Notes (Signed)
Patient came in for labs only.

## 2012-08-25 NOTE — Progress Notes (Signed)
Patient here for labs only. 

## 2012-08-26 LAB — NMR, LIPOPROFILE

## 2012-08-26 LAB — VITAMIN D 25 HYDROXY (VIT D DEFICIENCY, FRACTURES): Vit D, 25-Hydroxy: 36 ng/mL (ref 30–89)

## 2012-08-30 ENCOUNTER — Encounter: Payer: Self-pay | Admitting: Family Medicine

## 2012-09-02 ENCOUNTER — Ambulatory Visit (INDEPENDENT_AMBULATORY_CARE_PROVIDER_SITE_OTHER): Payer: 59

## 2012-09-02 ENCOUNTER — Encounter: Payer: Self-pay | Admitting: Family Medicine

## 2012-09-02 ENCOUNTER — Ambulatory Visit (INDEPENDENT_AMBULATORY_CARE_PROVIDER_SITE_OTHER): Payer: 59 | Admitting: Family Medicine

## 2012-09-02 VITALS — BP 113/71 | HR 69 | Temp 98.1°F | Ht 71.0 in | Wt 222.0 lb

## 2012-09-02 DIAGNOSIS — R7309 Other abnormal glucose: Secondary | ICD-10-CM

## 2012-09-02 DIAGNOSIS — R739 Hyperglycemia, unspecified: Secondary | ICD-10-CM | POA: Insufficient documentation

## 2012-09-02 DIAGNOSIS — E785 Hyperlipidemia, unspecified: Secondary | ICD-10-CM

## 2012-09-02 DIAGNOSIS — N3281 Overactive bladder: Secondary | ICD-10-CM | POA: Insufficient documentation

## 2012-09-02 DIAGNOSIS — M542 Cervicalgia: Secondary | ICD-10-CM

## 2012-09-02 DIAGNOSIS — M5137 Other intervertebral disc degeneration, lumbosacral region: Secondary | ICD-10-CM

## 2012-09-02 DIAGNOSIS — N318 Other neuromuscular dysfunction of bladder: Secondary | ICD-10-CM

## 2012-09-02 DIAGNOSIS — E669 Obesity, unspecified: Secondary | ICD-10-CM

## 2012-09-02 DIAGNOSIS — I10 Essential (primary) hypertension: Secondary | ICD-10-CM

## 2012-09-02 DIAGNOSIS — M51379 Other intervertebral disc degeneration, lumbosacral region without mention of lumbar back pain or lower extremity pain: Secondary | ICD-10-CM

## 2012-09-02 DIAGNOSIS — M5136 Other intervertebral disc degeneration, lumbar region: Secondary | ICD-10-CM | POA: Insufficient documentation

## 2012-09-02 DIAGNOSIS — M51369 Other intervertebral disc degeneration, lumbar region without mention of lumbar back pain or lower extremity pain: Secondary | ICD-10-CM | POA: Insufficient documentation

## 2012-09-02 LAB — POCT GLYCOSYLATED HEMOGLOBIN (HGB A1C): Hemoglobin A1C: 5.7

## 2012-09-02 NOTE — Assessment & Plan Note (Signed)
Dietary changes and weight reduction

## 2012-09-02 NOTE — Assessment & Plan Note (Signed)
Patient only has occasional flareup of his back pain. Controlled with over-the-counter acetaminophen. No intervention needed at this time. However recommended to patient, significant weight loss because this wouldn improve his back pain. Diet and exercise. Back care.

## 2012-09-02 NOTE — Assessment & Plan Note (Signed)
Again dietary changes and exercise and weight reduction discussed

## 2012-09-02 NOTE — Progress Notes (Signed)
Patient ID: Daniel Frank, male   DOB: [birth year], 62 y.o.   MRN: [id number] SUBJECTIVE:   HPI: Patient here for followup of his medical problems routinely. He's been doing fair well. Work stress is ongoing. New problem is on the left side of his neck is sore when he tries to turn his head and neck is stiff no neurological deficits in the upper extremities no paresthesias in the upper extremities. He does have a long history of having lumbar degenerative disc disease. In symptoms in his lower back is intermittent as well. Acetaminophen controls the pain when needed. However some intermittent. He just wanted his neck checked.  PMH/PSH:reviewed/updated in Epic SH/FH:reviewed/updated in Epic Meds:reviewed/updated in Epic Allergies:reviewed/updated in Epic Immunizations:reviewed/updated in Epic ROS: as above in HPI. Other systems are negative or stable today.  OBJECTIVE:   On examination he appeared in no acute distress. Obese waist is 46-1/2 inches Vital signs as documented. BP 113/71  Pulse 69  Temp(Src) 98.1 F (36.7 C) (Oral)  Ht 5\' 11"  (1.803 m)  Wt 222 lb (100.699 kg)  BMI 30.98 kg/m2  Skin warm and dry and without overt rashes.  Head &Neck without JVD. Decreased range of motion to 45 the right and to the left rotation of the head produces discomfort in the left side up the neck posteriorly. Flexion and extension produces mild pain. Lungs clear.  Heart exam notable for regular rhythm, normal sounds and absence of murmurs, rubs or gallops.  Abdomen unremarkable and without evidence of organomegaly, masses, or abdominal aortic enlargement.  Extremities nonedematous. Neurologic: oriented to name, place, and time. Nonfocal exam. ASSESSMENT:  Degenerative disc disease, lumbar Patient only has occasional flareup of his back pain. Controlled with over-the-counter acetaminophen. No intervention needed at this time. However recommended to patient, significant weight loss because this  wouldn improve his back pain. Diet and exercise. Back care.  Hyperglycemia Recommended significant weight loss. Dietary changes reducing carbs sugars and fats. Exercise. Plant base diet  Obesity, unspecified Dietary changes and weight reduction  Overactive bladder Follow up with the urologist  Other and unspecified hyperlipidemia Continue with present medications. His meds were reviewed. He is in NMR lipomed panel was not back yet. He is to call us for his lab results if he hasn't heard from [place] in 2 weeks  Unspecified essential hypertension Again dietary changes and exercise and weight reduction discussed  Neck pain on left side He is a new complaint of stiffness of the neck and pain in the left side of her neck especially when he rotates his head. He attributes this to degenerative disc disease/arthritis developing in the neck. He just wanted it checked out today so we did an x-ray and it showed significant degenerative disc disease and arthritic changes in the lower cervical vertebrae however his symptoms are intermittent and pain can be controlled with acetaminophen. Offered physical therapy. Patient defers at this time. He will consider it if the pain relapses and has not controlled.     PLAN:  Orders Placed This Encounter  Procedures  . MyChart Exercise Flowsheet    Order Specific Question:  After how many days would you like to receive a notification of this patient's flowsheet entries?    Answer:  63    Order Specific Question:  After how many readings would you like to receive notification of this patient's flowsheet entries?    Answer:  1  . MyChart Weight Flowsheet    Order Specific Question:  After how many days  would you like to receive a notification of this patient's flowsheet entries?    Answer:  51    Order Specific Question:  After how many readings would you like to receive notification of this patient's flowsheet entries?    Answer:  1  . DG Cervical Spine  Complete    Standing Status: Future     Number of Occurrences: 1     Standing Expiration Date: 11/02/2013    Order Specific Question:  Reason for Exam (SYMPTOM  OR DIAGNOSIS REQUIRED)    Answer:  neck pain    Order Specific Question:  Preferred imaging location?    Answer:  Internal  . POCT glycosylated hemoglobin (Hb A1C)   [facility] reading (PRIMARY) by  Dr. Leodis Sias: Significant degenerative disease of C4-5-6. Degenerative disc disease. Loss of disc space and spurring of the vertebra. Await official reading                               No orders of the defined types were placed in this encounter.   Results for orders placed in visit on 09/02/12 (from the past 24 hour(s))  POCT GLYCOSYLATED HEMOGLOBIN (HGB A1C)     Status: None   Collection Time    09/02/12  5:05 PM      Result Value Range   Hemoglobin A1C 5.7     continue present medications.  Janetta Vandoren P. Modesto Charon, M.D.

## 2012-09-02 NOTE — Assessment & Plan Note (Addendum)
Continue with present medications. His meds were reviewed. He is in NMR lipomed panel was not back yet. He is to call us for his lab results if he hasn't heard from [place] in 2 weeks

## 2012-09-02 NOTE — Patient Instructions (Addendum)
Degenerative Disk Disease Degenerative disk disease is a condition caused by the changes that occur in the cushions of the backbone (spinal disks) as you grow older. Spinal disks are soft and compressible disks located between the bones of the spine (vertebrae). They act like shock absorbers. Degenerative disk disease can affect the whole spine. However, the neck and lower back are most commonly affected. Many changes can occur in the spinal disks with aging, such as:  The spinal disks may dry and shrink.  Small tears may occur in the tough, outer covering of the disk (annulus).  The disk space may become smaller due to loss of water.  Abnormal growths in the bone (spurs) may occur. This can put pressure on the nerve roots exiting the spinal canal, causing pain.  The spinal canal may become narrowed. CAUSES  Degenerative disk disease is a condition caused by the changes that occur in the spinal disks with aging. The exact cause is not known, but there is a genetic basis for many patients. Degenerative changes can occur due to loss of fluid in the disk. This makes the disk thinner and reduces the space between the backbones. Small cracks can develop in the outer layer of the disk. This can lead to the breakdown of the disk. You are more likely to get degenerative disk disease if you are overweight. Smoking cigarettes and doing heavy work such as weightlifting can also increase your risk of this condition. Degenerative changes can start after a sudden injury. Growth of bone spurs can compress the nerve roots and cause pain.  SYMPTOMS  The symptoms vary from person to person. Some people may have no pain, while others have severe pain. The pain may be so severe that it can limit your activities. The location of the pain depends on the part of your backbone that is affected. You will have neck or arm pain if a disk in the neck area is affected. You will have pain in your back, buttocks, or legs if a disk  in the lower back is affected. The pain becomes worse while bending, reaching up, or with twisting movements. The pain may start gradually and then get worse as time passes. It may also start after a major or minor injury. You may feel numbness or tingling in the arms or legs.  DIAGNOSIS  Your caregiver will ask you about your symptoms and about activities or habits that may cause the pain. He or she may also ask about any injuries, diseases, or treatments you have had earlier. Your caregiver will examine you to check for the range of movement that is possible in the affected area, to check for strength in your extremities, and to check for sensation in the areas of the arms and legs supplied by different nerve roots. An X-ray of the spine may be taken. Your caregiver may suggest other imaging tests, such as magnetic resonance imaging (MRI), if needed.  TREATMENT  Treatment includes rest, modifying your activities, and applying ice and heat. Your caregiver may prescribe medicines to reduce your pain and may ask you to do some exercises to strengthen your back. In some cases, you may need surgery. You and your caregiver will decide on the treatment that is best for you. HOME CARE INSTRUCTIONS   Follow proper lifting and walking techniques as advised by your caregiver.  Maintain good posture.  Exercise regularly as advised.  Perform relaxation exercises.  Change your sitting, standing, and sleeping habits as advised. Change positions   frequently.  Lose weight as advised.  Stop smoking if you smoke.  Wear supportive footwear. SEEK MEDICAL CARE IF:  Your pain does not go away within 1 to 4 weeks. SEEK IMMEDIATE MEDICAL CARE IF:   Your pain is severe.  You notice weakness in your arms, hands, or legs.  You begin to lose control of your bladder or bowel movements. MAKE SURE YOU:   Understand these instructions.  Will watch your condition.  Will get help right away if you are not doing  well or get worse. Document Released: 03/22/2007 Document Revised: 08/17/2011 Document Reviewed: 03/22/2007 [facility] Patient Information 2013 [place], [place]. Degenerative Arthritis You have osteoarthritis. This is the wear and tear arthritis that comes with aging. It is also called degenerative arthritis. This is common in people past middle age. It is caused by stress on the joints. The large weight bearing joints of the lower extremities are most often affected. The knees, hips, back, neck, and hands can become painful, swollen, and stiff. This is the most common type of arthritis. It comes on with age, carrying too much weight, or from an injury. Treatment includes resting the sore joint until the pain and swelling improve. Crutches or a walker may be needed for severe flares. Only take over-the-counter or prescription medicines for pain, discomfort, or fever as directed by your caregiver. Local heat therapy may improve motion. Cortisone shots into the joint are sometimes used to reduce pain and swelling during flares. Osteoarthritis is usually not crippling and progresses slowly. There are things you can do to decrease pain:  Avoid high impact activities.  Exercise regularly.  Low impact exercises such as walking, biking and swimming help to keep the muscles strong and keep normal joint function.  Stretching helps to keep your range of motion.  Lose weight if you are overweight. This reduces joint stress. In severe cases when you have pain at rest or increasing disability, joint surgery may be helpful. See your caregiver for follow-up treatment as recommended.  SEEK IMMEDIATE MEDICAL CARE IF:   You have severe joint pain.  Marked swelling and redness in your joint develops.  You develop a high fever. Document Released: 05/25/2005 Document Revised: 08/17/2011 Document Reviewed: 10/25/2006 [facility] Patient Information 2013 [place], [place].

## 2012-09-02 NOTE — Assessment & Plan Note (Signed)
Recommended significant weight loss. Dietary changes reducing carbs sugars and fats. Exercise. Plant base diet

## 2012-09-02 NOTE — Assessment & Plan Note (Signed)
He is a new complaint of stiffness of the neck and pain in the left side of her neck especially when he rotates his head. He attributes this to degenerative disc disease/arthritis developing in the neck. He just wanted it checked out today so we did an x-ray and it showed significant degenerative disc disease and arthritic changes in the lower cervical vertebrae however his symptoms are intermittent and pain can be controlled with acetaminophen. Offered physical therapy. Patient defers at this time. He will consider it if the pain relapses and has not controlled.

## 2012-09-02 NOTE — Assessment & Plan Note (Signed)
Follow-up with the urologist °

## 2012-09-03 NOTE — Progress Notes (Signed)
Quick Note:  Call patient. Labs normal.The HGBA1C is borderline normal to early preDM. Treatment is weight loss and exercise. No change in plan. ______

## 2012-09-16 ENCOUNTER — Encounter: Payer: Self-pay | Admitting: Family Medicine

## 2012-12-26 ENCOUNTER — Other Ambulatory Visit (INDEPENDENT_AMBULATORY_CARE_PROVIDER_SITE_OTHER): Payer: 59

## 2012-12-26 DIAGNOSIS — E785 Hyperlipidemia, unspecified: Secondary | ICD-10-CM

## 2012-12-26 DIAGNOSIS — E559 Vitamin D deficiency, unspecified: Secondary | ICD-10-CM

## 2012-12-26 DIAGNOSIS — E1159 Type 2 diabetes mellitus with other circulatory complications: Secondary | ICD-10-CM

## 2012-12-26 LAB — POCT CBC
Granulocyte percent: 59.7 %G (ref 37–80)
HCT, POC: 42.2 % — AB (ref 43.5–53.7)
Hemoglobin: 14.5 g/dL (ref 14.1–18.1)
Lymph, poc: 2 (ref 0.6–3.4)
MCH, POC: 32.7 pg — AB (ref 27–31.2)
MCHC: 34.3 g/dL (ref 31.8–35.4)
MCV: 95.4 fL (ref 80–97)
MPV: 10 fL (ref 0–99.8)
POC Granulocyte: 3.4 (ref 2–6.9)
POC LYMPH PERCENT: 35.4 %L (ref 10–50)
Platelet Count, POC: 146 10*3/uL (ref 142–424)
RBC: 4.4 M/uL — AB (ref 4.69–6.13)
RDW, POC: 12.3 %
WBC: 5.7 10*3/uL (ref 4.6–10.2)

## 2012-12-26 LAB — POCT GLYCOSYLATED HEMOGLOBIN (HGB A1C): Hemoglobin A1C: 5.2

## 2012-12-27 LAB — COMPLETE METABOLIC PANEL WITH GFR
ALT: 11 U/L (ref 0–53)
AST: 15 U/L (ref 0–37)
Albumin: 4.1 g/dL (ref 3.5–5.2)
Alkaline Phosphatase: 49 U/L (ref 39–117)
BUN: 15 mg/dL (ref 6–23)
CO2: 28 mEq/L (ref 19–32)
Calcium: 9.1 mg/dL (ref 8.4–10.5)
Chloride: 104 mEq/L (ref 96–112)
Creat: 0.99 mg/dL (ref 0.50–1.35)
GFR, Est African American: >89 mL/min
GFR, Est Non African American: 83 mL/min
Glucose, Bld: 88 mg/dL (ref 70–99)
Potassium: 4.4 mEq/L (ref 3.5–5.3)
Sodium: 141 mEq/L (ref 135–145)
Total Bilirubin: 0.7 mg/dL (ref 0.3–1.2)
Total Protein: 6.3 g/dL (ref 6.0–8.3)

## 2012-12-27 LAB — NMR LIPOPROFILE WITH LIPIDS
Cholesterol, Total: 172 mg/dL (ref <200)
HDL Particle Number: 35 umol/L (ref >=30.5)
HDL Size: 8.4 nm — ABNORMAL LOW (ref >=9.2)
HDL-C: 46 mg/dL (ref >=40)
LDL (calc): 108 mg/dL — ABNORMAL HIGH (ref <100)
LDL Particle Number: 1253 nmol/L — ABNORMAL HIGH (ref <1000)
LDL Size: 21 nm (ref >20.5)
LP-IR Score: 41 (ref <=45)
Large HDL-P: 2.7 umol/L — ABNORMAL LOW (ref >=4.8)
Large VLDL-P: <0.8 nmol/L (ref <=2.7)
Small LDL Particle Number: 340 nmol/L (ref <=527)
Triglycerides: 88 mg/dL (ref <150)
VLDL Size: 39 nm (ref <=46.6)

## 2012-12-27 LAB — VITAMIN D 25 HYDROXY (VIT D DEFICIENCY, FRACTURES): Vit D, 25-Hydroxy: 43 ng/mL (ref 30–89)

## 2013-01-03 ENCOUNTER — Encounter: Payer: Self-pay | Admitting: Family Medicine

## 2013-01-03 ENCOUNTER — Ambulatory Visit (INDEPENDENT_AMBULATORY_CARE_PROVIDER_SITE_OTHER): Payer: 59 | Admitting: Family Medicine

## 2013-01-03 VITALS — BP 127/75 | HR 76 | Temp 97.7°F | Wt 217.6 lb

## 2013-01-03 DIAGNOSIS — M5136 Other intervertebral disc degeneration, lumbar region: Secondary | ICD-10-CM

## 2013-01-03 DIAGNOSIS — N318 Other neuromuscular dysfunction of bladder: Secondary | ICD-10-CM

## 2013-01-03 DIAGNOSIS — E785 Hyperlipidemia, unspecified: Secondary | ICD-10-CM | POA: Insufficient documentation

## 2013-01-03 DIAGNOSIS — N3281 Overactive bladder: Secondary | ICD-10-CM

## 2013-01-03 DIAGNOSIS — R209 Unspecified disturbances of skin sensation: Secondary | ICD-10-CM

## 2013-01-03 DIAGNOSIS — R202 Paresthesia of skin: Secondary | ICD-10-CM | POA: Insufficient documentation

## 2013-01-03 DIAGNOSIS — E669 Obesity, unspecified: Secondary | ICD-10-CM

## 2013-01-03 DIAGNOSIS — I10 Essential (primary) hypertension: Secondary | ICD-10-CM

## 2013-01-03 DIAGNOSIS — M5137 Other intervertebral disc degeneration, lumbosacral region: Secondary | ICD-10-CM

## 2013-01-03 DIAGNOSIS — M51369 Other intervertebral disc degeneration, lumbar region without mention of lumbar back pain or lower extremity pain: Secondary | ICD-10-CM

## 2013-01-03 NOTE — Patient Instructions (Signed)
      Dr Alexios Keown's Recommendations  Diet and Exercise discussed with patient.  For nutrition information, I recommend books:  1).Eat to Live by Dr Joel Fuhrman. 2).Prevent and Reverse Heart Disease by Dr Caldwell Esselstyn. 3) Dr Neal Barnard's Book:  Program to Reverse Diabetes  Exercise recommendations are:  If unable to walk, then the patient can exercise in a chair 3 times a day. By flapping arms like a bird gently and raising legs outwards to the front.  If ambulatory, the patient can go for walks for 30 minutes 3 times a week. Then increase the intensity and duration as tolerated.  Goal is to try to attain exercise frequency to 5 times a week.  If applicable: Best to perform resistance exercises (machines or weights) 2 days a week and cardio type exercises 3 days per week.  

## 2013-01-03 NOTE — Progress Notes (Signed)
Patient ID: Daniel Frank, male   DOB: 06/12/1950, 62 y.o.   MRN: [id number] SUBJECTIVE: CC: Chief Complaint  Patient presents with  . Follow-up    c/o burning feet,toes and hands  c/o plantar fascitis   HPI: Patient is here for follow up of hypertension: denies Headache;deniesChest Pain;denies weakness;denies Shortness of Breath or Orthopnea;denies Visual changes;denies palpitations;denies cough;denies pedal edema;denies symptoms of TIA or stroke; admits to Compliance with medications. denies Problems with medications.  Burning in the tips of the toes and hands and feet.  Past Medical History  Diagnosis Date  . Arthritis   . Depressive disorder, not elsewhere classified   . Other and unspecified hyperlipidemia   . Unspecified essential hypertension   . Sleep apnea     cpap  . Spastic dysuria   . Hyperactivity of bladder   . Organic impotence    No past surgical history on file. History   Social History  . Marital Status: Single    Spouse Name: N/A    Number of Children: N/A  . Years of Education: N/A   Occupational History  . Mapping supervisor    Social History Main Topics  . Smoking status: Former Smoker    Types: Cigarettes    Quit date: 07/27/2006  . Smokeless tobacco: Never Used  . Alcohol Use: 1.2 oz/week    2 Cans of beer per week  . Drug Use: No  . Sexually Active: Not on file   Other Topics Concern  . Not on file   Social History Narrative  . No narrative on file   Family History  Problem Relation Age of Onset  . Uterine cancer Sister   . Cancer Sister     uterine  . Heart disease Father   . Colon cancer Neg Hx   . Stomach cancer Neg Hx    Current Outpatient Prescriptions on File Prior to Visit  Medication Sig Dispense Refill  . aspirin 81 MG tablet Take 81 mg by mouth daily.        . Cholecalciferol (VITAMIN D) 2000 UNITS CAPS Take 1 capsule by mouth daily.        Marland Kitchen LOVAZA 1 G capsule Take 1 capsule by mouth 2 (two) times daily.       Marland Kitchen  olmesartan (BENICAR) 40 MG tablet Take 40 mg by mouth daily.        . niacin-simvastatin (SIMCOR) 1000-20 MG 24 hr tablet Take 1 tablet by mouth at bedtime.         No current facility-administered medications on file prior to visit.   No Known Allergies Immunization History  Administered Date(s) Administered  . Influenza Split 05/28/2011  . Tdap 08/07/2006   Prior to Admission medications   Medication Sig Start Date End Date Taking? Authorizing Provider  aspirin 81 MG tablet Take 81 mg by mouth daily.     Yes Historical Provider, MD  Cholecalciferol (VITAMIN D) 2000 UNITS CAPS Take 1 capsule by mouth daily.     Yes Historical Provider, MD  LOVAZA 1 G capsule Take 1 capsule by mouth 2 (two) times daily.  10/30/10  Yes Historical Provider, MD  olmesartan (BENICAR) 40 MG tablet Take 40 mg by mouth daily.     Yes Historical Provider, MD  niacin-simvastatin [facility]) 1000-20 MG 24 hr tablet Take 1 tablet by mouth at bedtime.      Historical Provider, MD    ROS: As above in the HPI. All other systems are stable or negative.  OBJECTIVE: APPEARANCE:  Patient in no acute distress.The patient appeared well nourished and normally developed. Acyanotic. Waist: VITAL SIGNS:BP 127/75  Pulse 76  Temp(Src) 97.7 F (36.5 C) (Oral)  Wt 217 lb 9.6 oz (98.703 kg)  BMI 30.36 kg/m2 WM Obese  SKIN: warm and  Dry without overt rashes, tattoos and scars  HEAD and Neck: without JVD, Head and scalp: normal Eyes:No scleral icterus. Fundi normal, eye movements normal. Ears: Auricle normal, canal normal, Tympanic membranes normal, insufflation normal. Nose: normal Throat: normal Neck & thyroid: normal  CHEST & LUNGS: Chest wall: normal Lungs: Clear  CVS: Reveals the PMI to be normally located. Regular rhythm, First and Second Heart sounds are normal,  absence of murmurs, rubs or gallops. Peripheral vasculature: Radial pulses: normal Dorsal pedis pulses: normal Posterior pulses:  normal  ABDOMEN:  Appearance: central obesity Benign, no organomegaly, no masses, no Abdominal Aortic enlargement. No Guarding , no rebound. No Bruits. Bowel sounds: normal  RECTAL: N/A GU: N/A  EXTREMETIES: nonedematous. Both Femoral and Pedal pulses are normal.  MUSCULOSKELETAL:  Spine: normal Joints: intact  NEUROLOGIC: oriented to time,place and person; nonfocal. Strength is normal Sensory is normal Reflexes are normal Cranial Nerves are normal.  Results for orders placed in visit on 12/26/12  COMPLETE METABOLIC PANEL WITH GFR      Result Value Range   Sodium 141  135 - 145 mEq/L   Potassium 4.4  3.5 - 5.3 mEq/L   Chloride 104  96 - 112 mEq/L   CO2 28  19 - 32 mEq/L   Glucose, Bld 88  70 - 99 mg/dL   BUN 15  6 - 23 mg/dL   Creat 1.61  0.96 - 0.45 mg/dL   Total Bilirubin 0.7  0.3 - 1.2 mg/dL   Alkaline Phosphatase 49  39 - 117 U/L   AST 15  0 - 37 U/L   ALT 11  0 - 53 U/L   Total Protein 6.3  6.0 - 8.3 g/dL   Albumin 4.1  3.5 - 5.2 g/dL   Calcium 9.1  8.4 - 40.9 mg/dL   GFR, Est African American >89     GFR, Est Non African American 83    NMR LIPOPROFILE WITH LIPIDS      Result Value Range   LDL Particle Number [id number] (*) <1000 nmol/L   LDL (calc) 108 (*) <100 mg/dL   HDL-C 46  >=81 mg/dL   Triglycerides 88  <191 mg/dL   Cholesterol, Total 478  <200 mg/dL   HDL Particle Number 29.5  >=62.1 umol/L   Large HDL-P 2.7 (*) >=4.8 umol/L   Large VLDL-P <0.8  <=2.7 nmol/L   Small LDL Particle Number 340  <=527 nmol/L   LDL Size 21.0  >20.5 nm   HDL Size 8.4 (*) >=9.2 nm   VLDL Size 39.0  <=46.6 nm   LP-IR Score 41  <=45  VITAMIN D 25 HYDROXY      Result Value Range   Vit D, 25-Hydroxy 43  30 - 89 ng/mL  POCT CBC      Result Value Range   WBC 5.7  4.6 - 10.2 K/uL   Lymph, poc 2.0  0.6 - 3.4   POC LYMPH PERCENT 35.4  10 - 50 %L   MID (cbc)    0 - 0.9   POC MID %    0 - 12 %M   POC Granulocyte 3.4  2 - 6.9   Granulocyte percent 59.7  37 -  80 %G   RBC 4.4  (*) 4.69 - 6.13 M/uL   Hemoglobin 14.5  14.1 - 18.1 g/dL   HCT, POC 16.1 (*) 09.6 - 53.7 %   MCV 95.4  80 - 97 fL   MCH, POC 32.7 (*) 27 - 31.2 pg   MCHC 34.3  31.8 - 35.4 g/dL   RDW, POC 04.5     Platelet Count, POC 146.0  142 - 424 K/uL   MPV 10.0  0 - 99.8 fL  POCT GLYCOSYLATED HEMOGLOBIN (HGB A1C)      Result Value Range   Hemoglobin A1C 5.2      ASSESSMENT: Paresthesias - Plan: ANA, Vitamin B12, Folate, Sedimentation rate, Ambulatory referral to Neurology  Unspecified essential hypertension  Degenerative disc disease, lumbar  Overactive bladder  Obesity, unspecified  HLD (hyperlipidemia) paresthesias involves the hand and feet in a stocking -glove pattern.  PLAN:  Orders Placed This Encounter  Procedures  . ANA  . Vitamin B12  . Folate  . Sedimentation rate  . Ambulatory referral to Neurology    Referral Priority:  Routine    Referral Type:  Consultation    Referral Reason:  Specialty Services Required    Requested Specialty:  Neurology    Number of Visits Requested:  1    Reviewed labs with patient       Dr Woodroe Mode Recommendations  Diet and Exercise discussed with patient.  For nutrition information, I recommend books:  1).Eat to Live by Dr Monico Hoar. 2).Prevent and Reverse Heart Disease by Dr Suzzette Righter. 3) Dr Katherina Right Book:  Program to Reverse Diabetes  Exercise recommendations are:  If unable to walk, then the patient can exercise in a chair 3 times a day. By flapping arms like a bird gently and raising legs outwards to the front.  If ambulatory, the patient can go for walks for 30 minutes 3 times a week. Then increase the intensity and duration as tolerated.  Goal is to try to attain exercise frequency to 5 times a week.  If applicable: Best to perform resistance exercises (machines or weights) 2 days a week and cardio type exercises 3 days per week.  Return in about 3 months (around 04/05/2013) for Recheck medical  problems.  Debrah Granderson P. Modesto Charon, M.D.

## 2013-01-04 LAB — FOLATE: Folate: 15 ng/mL (ref >3.0)

## 2013-01-04 LAB — ANA: Anti Nuclear Antibody(ANA): NEGATIVE

## 2013-01-04 LAB — SEDIMENTATION RATE

## 2013-01-04 LAB — VITAMIN B12: Vitamin B-12: 1086 pg/mL — ABNORMAL HIGH (ref 211–946)

## 2013-01-17 NOTE — Addendum Note (Signed)
Addended by: Orma Render F on: 01/17/2013 09:55 AM   Modules accepted: Orders

## 2013-01-24 ENCOUNTER — Ambulatory Visit (INDEPENDENT_AMBULATORY_CARE_PROVIDER_SITE_OTHER): Payer: 59 | Admitting: Neurology

## 2013-01-24 ENCOUNTER — Other Ambulatory Visit: Payer: Self-pay | Admitting: Neurology

## 2013-01-24 ENCOUNTER — Encounter: Payer: Self-pay | Admitting: Neurology

## 2013-01-24 VITALS — BP 119/73 | HR 75 | Temp 98.3°F | Ht 71.0 in | Wt 216.5 lb

## 2013-01-24 DIAGNOSIS — R209 Unspecified disturbances of skin sensation: Secondary | ICD-10-CM

## 2013-01-24 DIAGNOSIS — IMO0001 Reserved for inherently not codable concepts without codable children: Secondary | ICD-10-CM

## 2013-01-24 DIAGNOSIS — M542 Cervicalgia: Secondary | ICD-10-CM

## 2013-01-24 DIAGNOSIS — R52 Pain, unspecified: Secondary | ICD-10-CM

## 2013-01-24 DIAGNOSIS — G243 Spasmodic torticollis: Secondary | ICD-10-CM

## 2013-01-24 NOTE — Patient Instructions (Addendum)
I think overall you are doing fairly well but I do want to suggest a few things today:  Remember to drink plenty of fluid, eat healthy meals and do not skip any meals. Try to eat protein with a every meal and eat a healthy snack such as fruit or nuts in between meals. Try to keep a regular sleep-wake schedule and try to exercise daily, particularly in the form of walking, 20-30 minutes a day, if you can.   As far as your medications are concerned, I would like to suggest no new medication as yet.    As far as diagnostic testing: blood work, MRI cervical spine and EMG, NCV on your limbs.   I would like to see you back in 3 months, sooner if we need to. Please call us with any interim questions, concerns, problems, updates or refill requests.  Please also call us for any test results so we can go over those with you on the phone. Brett Canales is my clinical assistant and will answer any of your questions and relay your messages to me and also relay most of my messages to you.  Our phone number is [phone]. We also have an after hours call service for urgent matters and there is a physician on-call for urgent questions. For any emergencies you know to call 911 or go to the nearest emergency room.
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
## 2013-02-01 LAB — IFE AND PE, SERUM
Albumin/Glob SerPl: 2 (ref 0.7–2.0)
Alpha 1: 0.2 g/dL (ref 0.1–0.4)
Gamma Glob SerPl Elph-Mcnc: 0.6 g/dL (ref 0.5–1.6)
IgA/Immunoglobulin A, Serum: 203 mg/dL (ref 91–414)
IgG (Immunoglobin G), Serum: 695 mg/dL — ABNORMAL LOW (ref 700–1600)

## 2013-02-01 LAB — RPR: RPR: NONREACTIVE

## 2013-02-01 LAB — PLEASE NOTE

## 2013-02-01 LAB — C-REACTIVE PROTEIN: CRP: 0.9 mg/L (ref 0.0–4.9)

## 2013-02-01 LAB — VITAMIN E: Vitamin E (Alpha Tocopherol): 15.6 mg/L (ref 4.6–17.8)

## 2013-02-01 LAB — RHEUMATOID FACTOR: Rhuematoid fact SerPl-aCnc: 9.1 IU/mL (ref 0.0–13.9)

## 2013-02-02 ENCOUNTER — Telehealth: Payer: Self-pay | Admitting: Neurology

## 2013-02-02 NOTE — Telephone Encounter (Signed)
Called patient and let him know once his results are read someone will call him with results. He showed understanding

## 2013-02-02 NOTE — Progress Notes (Signed)
Quick Note:  Please call patient and advise patient that recent blood work showed no significant abnormalities, we checked rheumatological labs, inflammatory and autoimmune markers as well as muscle enzymes. ______

## 2013-02-03 ENCOUNTER — Telehealth: Payer: Self-pay | Admitting: Family Medicine

## 2013-02-03 NOTE — Progress Notes (Signed)
Quick Note:  Spoke with patient and relayed results of blood work. The patient was also reminded of any future appointments. Patient understood and had no questions.  ______ 

## 2013-02-08 ENCOUNTER — Ambulatory Visit (INDEPENDENT_AMBULATORY_CARE_PROVIDER_SITE_OTHER): Payer: 59 | Admitting: Neurology

## 2013-02-08 ENCOUNTER — Encounter (INDEPENDENT_AMBULATORY_CARE_PROVIDER_SITE_OTHER): Payer: Self-pay

## 2013-02-08 DIAGNOSIS — IMO0001 Reserved for inherently not codable concepts without codable children: Secondary | ICD-10-CM

## 2013-02-08 DIAGNOSIS — R52 Pain, unspecified: Secondary | ICD-10-CM

## 2013-02-08 DIAGNOSIS — G243 Spasmodic torticollis: Secondary | ICD-10-CM

## 2013-02-08 DIAGNOSIS — Z0289 Encounter for other administrative examinations: Secondary | ICD-10-CM

## 2013-02-08 DIAGNOSIS — M542 Cervicalgia: Secondary | ICD-10-CM

## 2013-02-08 DIAGNOSIS — R209 Unspecified disturbances of skin sensation: Secondary | ICD-10-CM

## 2013-02-08 NOTE — Procedures (Signed)
  HISTORY:  Daniel Frank is a 62 year old gentleman with a several month history of burning sensations on the arms and legs. The patient does have some neck discomfort. The patient is being evaluated for the sensory alteration.  NERVE CONDUCTION STUDIES:  Nerve conduction studies were performed on both upper extremities. The distal motor latencies and motor amplitudes for the median and ulnar nerves were within normal limits. The F wave latencies and nerve conduction velocities for these nerves were also normal. The sensory latencies for the median and ulnar nerves were normal.  Nerve conduction studies were performed on the right lower extremity. The distal motor latencies and motor amplitudes for the peroneal and posterior tibial nerves were within normal limits. The nerve conduction velocities for these nerves were also normal. The H reflex latency was normal. The sensory latencies for the peroneal, and the medial and lateral plantar sensory nerves were within normal limits.   EMG STUDIES:  EMG study was performed on the right upper extremity:  The first dorsal interosseous muscle reveals 2 to 4 K units with full recruitment. No fibrillations or positive waves were noted. The abductor pollicis brevis muscle reveals 2 to 4 K units with full recruitment. No fibrillations or positive waves were noted. The extensor indicis proprius muscle reveals 1 to 3 K units with full recruitment. No fibrillations or positive waves were noted. The pronator teres muscle reveals 2 to 3 K units with full recruitment. No fibrillations or positive waves were noted. The biceps muscle reveals 1 to 3 K units with full recruitment. No fibrillations or positive waves were noted. The triceps muscle reveals 2 to 4 K units with full recruitment. No fibrillations or positive waves were noted. The anterior deltoid muscle reveals 2 to 3 K units with full recruitment. No fibrillations or positive waves were noted. The  cervical paraspinal muscles were tested at 2 levels. No abnormalities of insertional activity were seen at either level tested. There was good relaxation.  EMG study was performed on the right lower extremity:  The tibialis anterior muscle reveals 2 to 4K motor units with full recruitment. No fibrillations or positive waves were seen. The peroneus tertius muscle reveals 2 to 4K motor units with full recruitment. No fibrillations or positive waves were seen. The medial gastrocnemius muscle reveals 1 to 3K motor units with full recruitment. No fibrillations or positive waves were seen. The vastus lateralis muscle reveals 2 to 4K motor units with full recruitment. No fibrillations or positive waves were seen. The iliopsoas muscle reveals 2 to 4K motor units with full recruitment. No fibrillations or positive waves were seen. The biceps femoris muscle (long head) reveals 2 to 4K motor units with full recruitment. No fibrillations or positive waves were seen. The lumbosacral paraspinal muscles were tested at 3 levels, and revealed no abnormalities of insertional activity at all 3 levels tested. There was good relaxation.   IMPRESSION:  Nerve conduction studies done on both upper extremities and on the right lower extremity were unremarkable. There is no evidence of a neuropathy. EMG evaluation of the right upper and right lower extremities were unremarkable, without evidence of a cervical or a lumbosacral radiculopathy on the right.  Marlan Palau MD 02/08/2013 4:48 PM  [facility] [address] [place], [place] [address]  Phone [phone] Fax [phone]

## 2013-02-09 ENCOUNTER — Ambulatory Visit (INDEPENDENT_AMBULATORY_CARE_PROVIDER_SITE_OTHER): Payer: 59

## 2013-02-09 DIAGNOSIS — G243 Spasmodic torticollis: Secondary | ICD-10-CM

## 2013-02-09 DIAGNOSIS — M542 Cervicalgia: Secondary | ICD-10-CM

## 2013-02-09 DIAGNOSIS — R209 Unspecified disturbances of skin sensation: Secondary | ICD-10-CM

## 2013-02-09 DIAGNOSIS — M47812 Spondylosis without myelopathy or radiculopathy, cervical region: Secondary | ICD-10-CM

## 2013-02-09 DIAGNOSIS — R52 Pain, unspecified: Secondary | ICD-10-CM

## 2013-02-09 DIAGNOSIS — IMO0001 Reserved for inherently not codable concepts without codable children: Secondary | ICD-10-CM

## 2013-02-09 NOTE — Progress Notes (Signed)
Quick Note:  Please call and advise the patient that the recent EMG and nerve conduction velocity test, which is the electrical nerve and muscle test we we performed, was reported as within normal limits. We checked for abnormal electrical discharges in the muscles or nerves and the report suggested normal findings. No further action is required on this test at this time. Please remind patient to keep any upcoming appointments or tests and to call us with any interim questions, concerns, problems or updates. Thanks,  Jayden Rudge, MD, PhD   ______ 

## 2013-02-10 NOTE — Progress Notes (Signed)
Quick Note:  Please advise patient that his recent MRI cervical spine showed prominent degenerative changes from C4-C6, most significant at C5-6 where there is a  broad-based disc osteophyte protrusion resulting in slight displacement of the spinal cord. However there is no definitive cord compression or signal changes to the spinal cord. I do believe it may be time to see a neurosurgeon again for evaluation and potential treatment. To that end, I would like for him to seek consultation with neurosurgery and I placed a referral in that regard. Please advise patient. He has previously seen Dr. Danielle Dess.   Huston Foley, MD, PhD [facility] ([facility]  ______

## 2013-02-13 ENCOUNTER — Ambulatory Visit: Payer: 59 | Admitting: Family Medicine

## 2013-02-13 NOTE — Telephone Encounter (Signed)
Will be seen

## 2013-02-13 NOTE — Progress Notes (Signed)
Quick Note:  I called pt and relayed the results of NCS/EMG and MRI Cspine. Relayed about about referral to Dr. Danielle Dess at [facility]. He will f/u if does not hear from them re: appt. ______

## 2013-03-30 ENCOUNTER — Other Ambulatory Visit (INDEPENDENT_AMBULATORY_CARE_PROVIDER_SITE_OTHER): Payer: 59

## 2013-03-30 DIAGNOSIS — M5136 Other intervertebral disc degeneration, lumbar region: Secondary | ICD-10-CM

## 2013-03-30 DIAGNOSIS — M5137 Other intervertebral disc degeneration, lumbosacral region: Secondary | ICD-10-CM

## 2013-04-03 LAB — SEDIMENTATION RATE: Sed Rate: 2 mm/hr (ref 0–30)

## 2013-04-03 LAB — IFE AND PE, SERUM
Albumin SerPl Elph-Mcnc: 4 g/dL (ref 3.2–5.6)
Albumin/Glob SerPl: 1.8 (ref 0.7–2.0)
Alpha 1: 0.2 g/dL (ref 0.1–0.4)
Alpha2 Glob SerPl Elph-Mcnc: 0.6 g/dL (ref 0.4–1.2)
B-Globulin SerPl Elph-Mcnc: 0.9 g/dL (ref 0.6–1.3)
Gamma Glob SerPl Elph-Mcnc: 0.6 g/dL (ref 0.5–1.6)
Globulin, Total: 2.3 g/dL (ref 2.0–4.5)
IgA/Immunoglobulin A, Serum: 204 mg/dL (ref 91–414)
IgG (Immunoglobin G), Serum: 670 mg/dL — ABNORMAL LOW (ref 700–1600)
IgM (Immunoglobulin M), Srm: 138 mg/dL (ref 40–230)
Total Protein: 6.3 g/dL (ref 6.0–8.5)

## 2013-04-03 LAB — RPR: RPR: NONREACTIVE

## 2013-04-03 LAB — HTLV-I/II ANTIBODIES, QUAL.: HTLV I/II Ab: NEGATIVE

## 2013-04-03 LAB — RHEUMATOID FACTOR: Rhuematoid fact SerPl-aCnc: <7 IU/mL (ref 0.0–13.9)

## 2013-04-03 LAB — CK: Total CK: 163 U/L (ref 24–204)

## 2013-04-03 LAB — VITAMIN E

## 2013-04-03 LAB — CREATININE KINASE MB: CK-MB Index: 2.3 ng/mL (ref 0.0–5.0)

## 2013-04-03 LAB — ALDOLASE: Aldolase: 6.2 U/L (ref 1.2–7.6)

## 2013-04-07 ENCOUNTER — Encounter: Payer: Self-pay | Admitting: Family Medicine

## 2013-04-07 ENCOUNTER — Ambulatory Visit (INDEPENDENT_AMBULATORY_CARE_PROVIDER_SITE_OTHER): Payer: 59 | Admitting: Family Medicine

## 2013-04-07 VITALS — BP 133/79 | HR 67 | Temp 97.5°F | Ht 71.0 in | Wt 221.4 lb

## 2013-04-07 DIAGNOSIS — M5137 Other intervertebral disc degeneration, lumbosacral region: Secondary | ICD-10-CM

## 2013-04-07 DIAGNOSIS — M5136 Other intervertebral disc degeneration, lumbar region: Secondary | ICD-10-CM

## 2013-04-07 DIAGNOSIS — M51379 Other intervertebral disc degeneration, lumbosacral region without mention of lumbar back pain or lower extremity pain: Secondary | ICD-10-CM

## 2013-04-07 DIAGNOSIS — R7309 Other abnormal glucose: Secondary | ICD-10-CM

## 2013-04-07 DIAGNOSIS — Z23 Encounter for immunization: Secondary | ICD-10-CM

## 2013-04-07 DIAGNOSIS — M542 Cervicalgia: Secondary | ICD-10-CM

## 2013-04-07 DIAGNOSIS — I10 Essential (primary) hypertension: Secondary | ICD-10-CM

## 2013-04-07 DIAGNOSIS — R202 Paresthesia of skin: Secondary | ICD-10-CM

## 2013-04-07 DIAGNOSIS — R739 Hyperglycemia, unspecified: Secondary | ICD-10-CM

## 2013-04-07 DIAGNOSIS — M51369 Other intervertebral disc degeneration, lumbar region without mention of lumbar back pain or lower extremity pain: Secondary | ICD-10-CM

## 2013-04-07 DIAGNOSIS — R209 Unspecified disturbances of skin sensation: Secondary | ICD-10-CM

## 2013-04-07 DIAGNOSIS — E785 Hyperlipidemia, unspecified: Secondary | ICD-10-CM

## 2013-04-07 LAB — POCT GLYCOSYLATED HEMOGLOBIN (HGB A1C): Hemoglobin A1C: 5.3

## 2013-04-07 NOTE — Progress Notes (Signed)
Patient ID: Daniel Frank, male   DOB: [birth year], 62 y.o.   MRN: [id number] SUBJECTIVE: CC: Chief Complaint  Patient presents with  . Follow-up    3 month follow up ?refill llovaza    HPI:  Patient is here for follow up of hyperlipidemia/hyperglycemia/cervical radiculopathy: denies Headache;denies Chest Pain;denies weakness;denies Shortness of Breath and orthopnea;denies Visual changes;denies palpitations;denies cough;denies pedal edema;denies symptoms of TIA or stroke;deniesClaudication symptoms. admits to Compliance with medications; denies Problems with medications.   Past Medical History  Diagnosis Date  . Arthritis   . Depressive disorder, not elsewhere classified   . Other and unspecified hyperlipidemia   . Unspecified essential hypertension   . Sleep apnea     cpap  . Spastic dysuria   . Hyperactivity of bladder   . Organic impotence   . Degenerative disc disease    No past surgical history on file. History   Social History  . Marital Status: Single    Spouse Name: N/A    Number of Children: 0  . Years of Education: Tech sch   Occupational History  . Producer, television/film/video   .  [employer]   Social History Main Topics  . Smoking status: Former Smoker    Types: Cigarettes    Quit date: 07/27/2006  . Smokeless tobacco: Never Used  . Alcohol Use: 3.0 oz/week    5 Cans of beer per week  . Drug Use: No  . Sexual Activity: Not on file   Other Topics Concern  . Not on file   Social History Narrative   Patient lives at home with his mother.   Caffeine  Use: 2 cups of coffee daily and some soda   Family History  Problem Relation Age of Onset  . Uterine cancer Sister   . Cancer Sister     uterine  . Heart disease Father   . Colon cancer Neg Hx   . Stomach cancer Neg Hx    Current Outpatient Prescriptions on File Prior to Visit  Medication Sig Dispense Refill  . aspirin 81 MG tablet Take 81 mg by mouth daily.        . Cholecalciferol (VITAMIN D)  2000 UNITS CAPS Take 1 capsule by mouth daily.        Marland Kitchen LOVAZA 1 G capsule Take 1 capsule by mouth 2 (two) times daily.       . niacin-simvastatin (SIMCOR) 1000-20 MG 24 hr tablet Take 1 tablet by mouth at bedtime.        Marland Kitchen olmesartan (BENICAR) 40 MG tablet Take 40 mg by mouth daily.         No current facility-administered medications on file prior to visit.   No Known Allergies Immunization History  Administered Date(s) Administered  . Influenza Split 05/28/2011  . Influenza,inj,Quad PF,36+ Mos 04/07/2013  . Tdap 08/07/2006   Prior to Admission medications   Medication Sig Start Date End Date Taking? Authorizing Provider  aspirin 81 MG tablet Take 81 mg by mouth daily.     Yes Historical Provider, MD  Cholecalciferol (VITAMIN D) 2000 UNITS CAPS Take 1 capsule by mouth daily.     Yes Historical Provider, MD  LOVAZA 1 G capsule Take 1 capsule by mouth 2 (two) times daily.  10/30/10  Yes Historical Provider, MD  niacin-simvastatin (SIMCOR) 1000-20 MG 24 hr tablet Take 1 tablet by mouth at bedtime.     Yes Historical Provider, MD  olmesartan (BENICAR) 40 MG tablet Take 40 mg by mouth  daily.     Yes Historical Provider, MD     ROS: As above in the HPI. All other systems are stable or negative.  OBJECTIVE: APPEARANCE:  Patient in no acute distress.The patient appeared well nourished and normally developed. Acyanotic. Waist: VITAL SIGNS:BP 133/79  Pulse 67  Temp(Src) 97.5 F (36.4 C) (Oral)  Ht 5\' 11"  (1.803 m)  Wt 221 lb 6.4 oz (100.426 kg)  BMI 30.89 kg/m2 Obese WM  SKIN: warm and  Dry without overt rashes, tattoos and scars  HEAD and Neck: without JVD, Head and scalp: normal Eyes:No scleral icterus. Fundi normal, eye movements normal. Ears: Auricle normal, canal normal, Tympanic membranes normal, insufflation normal. Nose: normal Throat: normal Neck: reduced ROM  thyroid: normal  CHEST & LUNGS: Chest wall: normal Lungs: Clear  CVS: Reveals the PMI to be normally  located. Regular rhythm, First and Second Heart sounds are normal,  absence of murmurs, rubs or gallops. Peripheral vasculature: Radial pulses: normal Dorsal pedis pulses: normal Posterior pulses: normal  ABDOMEN:  Appearance: Obese Benign, no organomegaly, no masses, no Abdominal Aortic enlargement. No Guarding , no rebound. No Bruits. Bowel sounds: normal  RECTAL: N/A GU: N/A  EXTREMETIES: nonedematous.  MUSCULOSKELETAL:  Spine: cervical: reduced ROM Joints: intact  NEUROLOGIC: oriented to time,place and person; nonfocal. Strength is normal C/o of paresthesias in upper extremities Reflexes are normal Cranial Nerves are normal.  Results for orders placed in visit on 04/07/13  POCT GLYCOSYLATED HEMOGLOBIN (HGB A1C)      Result Value Range   Hemoglobin A1C 5.3 %      ASSESSMENT: HLD (hyperlipidemia) - Plan: CMP14+EGFR, NMR, lipoprofile  Hyperglycemia - Plan: POCT glycosylated hemoglobin (Hb A1C)  Need for prophylactic vaccination and inoculation against influenza  Degenerative disc disease, lumbar  Unspecified essential hypertension  Paresthesias  Neck pain on left side  PLAN:  Results for orders placed in visit on 04/07/13  POCT GLYCOSYLATED HEMOGLOBIN (HGB A1C)      Result Value Range   Hemoglobin A1C 5.3 %          Dr Woodroe Mode Recommendations  For nutrition information, I recommend books:  1).Eat to Live by Dr Monico Hoar. 2).Prevent and Reverse Heart Disease by Dr Suzzette Righter. 3) Dr Katherina Right Book:  Program to Reverse Diabetes  Exercise recommendations are:  If unable to walk, then the patient can exercise in a chair 3 times a day. By flapping arms like a bird gently and raising legs outwards to the front.  If ambulatory, the patient can go for walks for 30 minutes 3 times a week. Then increase the intensity and duration as tolerated.  Goal is to try to attain exercise frequency to 5 times a week.  If applicable: Best to  perform resistance exercises (machines or weights) 2 days a week and cardio type exercises 3 days per week.  Orders Placed This Encounter  Procedures  . CMP14+EGFR  . NMR, lipoprofile  . POCT glycosylated hemoglobin (Hb A1C)   diet and weight loss Reviewed neurology notes. Agree with his neurosurgical consultation  No orders of the defined types were placed in this encounter.   There are no discontinued medications. Return in about 3 months (around 07/08/2013) for Recheck medical problems.  Mak Bonny P. Modesto Charon, M.D.

## 2013-04-07 NOTE — Patient Instructions (Signed)
      Dr Kaari Zeigler's Recommendations  For nutrition information, I recommend books:  1).Eat to Live by Dr Joel Fuhrman. 2).Prevent and Reverse Heart Disease by Dr Caldwell Esselstyn. 3) Dr Neal Barnard's Book:  Program to Reverse Diabetes  Exercise recommendations are:  If unable to walk, then the patient can exercise in a chair 3 times a day. By flapping arms like a bird gently and raising legs outwards to the front.  If ambulatory, the patient can go for walks for 30 minutes 3 times a week. Then increase the intensity and duration as tolerated.  Goal is to try to attain exercise frequency to 5 times a week.  If applicable: Best to perform resistance exercises (machines or weights) 2 days a week and cardio type exercises 3 days per week.  

## 2013-04-09 LAB — NMR, LIPOPROFILE
Cholesterol: 213 mg/dL — ABNORMAL HIGH (ref <200)
HDL Cholesterol by NMR: 54 mg/dL (ref >=40)
HDL Particle Number: 43 umol/L (ref >=30.5)
LDL Particle Number: 1454 nmol/L — ABNORMAL HIGH (ref <1000)
LDL Size: 20.5 nm — ABNORMAL LOW (ref >20.5)
LDLC SERPL CALC-MCNC: 116 mg/dL — ABNORMAL HIGH (ref <100)
LP-IR Score: 82 — ABNORMAL HIGH (ref <=45)
Small LDL Particle Number: 730 nmol/L — ABNORMAL HIGH (ref <=527)
Triglycerides by NMR: 215 mg/dL — ABNORMAL HIGH (ref <150)

## 2013-04-09 LAB — CMP14+EGFR
ALT: 14 IU/L (ref 0–44)
AST: 18 IU/L (ref 0–40)
Albumin/Globulin Ratio: 2.3 (ref 1.1–2.5)
Albumin: 4.3 g/dL (ref 3.6–4.8)
Alkaline Phosphatase: 66 IU/L (ref 39–117)
BUN/Creatinine Ratio: 13 (ref 10–22)
BUN: 14 mg/dL (ref 8–27)
CO2: 28 mmol/L (ref 18–29)
Calcium: 9.2 mg/dL (ref 8.6–10.2)
Chloride: 101 mmol/L (ref 97–108)
Creatinine, Ser: 1.04 mg/dL (ref 0.76–1.27)
GFR calc Af Amer: 90 mL/min/{1.73_m2} (ref >59)
GFR calc non Af Amer: 78 mL/min/{1.73_m2} (ref >59)
Globulin, Total: 1.9 g/dL (ref 1.5–4.5)
Glucose: 88 mg/dL (ref 65–99)
Potassium: 4.5 mmol/L (ref 3.5–5.2)
Sodium: 142 mmol/L (ref 134–144)
Total Bilirubin: 0.3 mg/dL (ref 0.0–1.2)
Total Protein: 6.2 g/dL (ref 6.0–8.5)

## 2013-04-12 ENCOUNTER — Other Ambulatory Visit: Payer: Self-pay | Admitting: Family Medicine

## 2013-04-12 DIAGNOSIS — E785 Hyperlipidemia, unspecified: Secondary | ICD-10-CM

## 2013-04-12 MED ORDER — ATORVASTATIN CALCIUM 40 MG PO TABS: 40.0000 mg | ORAL_TABLET | Freq: Every day | ORAL | Status: DC

## 2013-06-05 ENCOUNTER — Ambulatory Visit: Payer: 59 | Admitting: Neurology

## 2013-06-06 ENCOUNTER — Other Ambulatory Visit: Payer: Self-pay | Admitting: Family Medicine

## 2013-07-05 ENCOUNTER — Other Ambulatory Visit (INDEPENDENT_AMBULATORY_CARE_PROVIDER_SITE_OTHER): Payer: 59

## 2013-07-05 DIAGNOSIS — E785 Hyperlipidemia, unspecified: Secondary | ICD-10-CM

## 2013-07-05 DIAGNOSIS — I10 Essential (primary) hypertension: Secondary | ICD-10-CM

## 2013-07-06 LAB — CMP14+EGFR
ALT: 39 IU/L (ref 0–44)
AST: 28 IU/L (ref 0–40)
Albumin/Globulin Ratio: 2.2 (ref 1.1–2.5)
Albumin: 4.3 g/dL (ref 3.6–4.8)
Alkaline Phosphatase: 75 IU/L (ref 39–117)
BUN/Creatinine Ratio: 13 (ref 10–22)
BUN: 13 mg/dL (ref 8–27)
CO2: 24 mmol/L (ref 18–29)
Calcium: 9.3 mg/dL (ref 8.6–10.2)
Chloride: 103 mmol/L (ref 97–108)
Creatinine, Ser: 0.97 mg/dL (ref 0.76–1.27)
GFR calc Af Amer: 98 mL/min/{1.73_m2} (ref >59)
GFR calc non Af Amer: 84 mL/min/{1.73_m2} (ref >59)
Globulin, Total: 2 g/dL (ref 1.5–4.5)
Glucose: 103 mg/dL — ABNORMAL HIGH (ref 65–99)
Potassium: 4.6 mmol/L (ref 3.5–5.2)
Sodium: 144 mmol/L (ref 134–144)
Total Bilirubin: 0.6 mg/dL (ref 0.0–1.2)
Total Protein: 6.3 g/dL (ref 6.0–8.5)

## 2013-07-06 LAB — NMR, LIPOPROFILE
Cholesterol: 129 mg/dL (ref <200)
HDL Cholesterol by NMR: 52 mg/dL (ref >=40)
HDL Particle Number: 39.6 umol/L (ref >=30.5)
LDL Particle Number: 947 nmol/L (ref <1000)
LDL Size: 20.5 nm — ABNORMAL LOW (ref >20.5)
LDLC SERPL CALC-MCNC: 65 mg/dL (ref <100)
LP-IR Score: 70 — ABNORMAL HIGH (ref <=45)
Small LDL Particle Number: 387 nmol/L (ref <=527)
Triglycerides by NMR: 62 mg/dL (ref <150)

## 2013-07-14 ENCOUNTER — Encounter: Payer: Self-pay | Admitting: Family Medicine

## 2013-07-14 ENCOUNTER — Ambulatory Visit (INDEPENDENT_AMBULATORY_CARE_PROVIDER_SITE_OTHER): Payer: 59 | Admitting: Family Medicine

## 2013-07-14 VITALS — BP 120/58 | HR 72 | Temp 98.2°F | Ht 71.0 in | Wt 227.6 lb

## 2013-07-14 DIAGNOSIS — M5137 Other intervertebral disc degeneration, lumbosacral region: Secondary | ICD-10-CM

## 2013-07-14 DIAGNOSIS — R7309 Other abnormal glucose: Secondary | ICD-10-CM

## 2013-07-14 DIAGNOSIS — E669 Obesity, unspecified: Secondary | ICD-10-CM

## 2013-07-14 DIAGNOSIS — N318 Other neuromuscular dysfunction of bladder: Secondary | ICD-10-CM

## 2013-07-14 DIAGNOSIS — M51369 Other intervertebral disc degeneration, lumbar region without mention of lumbar back pain or lower extremity pain: Secondary | ICD-10-CM

## 2013-07-14 DIAGNOSIS — G4739 Other sleep apnea: Secondary | ICD-10-CM

## 2013-07-14 DIAGNOSIS — I10 Essential (primary) hypertension: Secondary | ICD-10-CM

## 2013-07-14 DIAGNOSIS — R739 Hyperglycemia, unspecified: Secondary | ICD-10-CM

## 2013-07-14 DIAGNOSIS — N3281 Overactive bladder: Secondary | ICD-10-CM

## 2013-07-14 DIAGNOSIS — E785 Hyperlipidemia, unspecified: Secondary | ICD-10-CM

## 2013-07-14 DIAGNOSIS — M5136 Other intervertebral disc degeneration, lumbar region: Secondary | ICD-10-CM

## 2013-07-14 MED ORDER — OLMESARTAN MEDOXOMIL 40 MG PO TABS: ORAL_TABLET | ORAL | Status: DC

## 2013-07-14 NOTE — Progress Notes (Signed)
Patient ID: Daniel Frank, male   DOB: Apr 24, 1951, 63 y.o.   MRN: [id number] SUBJECTIVE: CC: Chief Complaint  Patient presents with  . Follow-up    3 month follow up chronic problems. wants to know if should continue B!2    HPI:  Patient is here for follow up of hyperlipidemia/HTN/Sleep apnea/: denies Headache;denies Chest Pain;denies weakness;denies Shortness of Breath and orthopnea;denies Visual changes;denies palpitations;denies cough;denies pedal edema;denies symptoms of TIA or stroke;deniesClaudication symptoms. admits to Compliance with medications; denies Problems with medications.  Paresthesias of the hands and feet: hands resolved , still has some of the feet. Working on core strength and losing weight.  Saw DR Ellene Route.: for his neck. Conservative measures recommended.  Past Medical History  Diagnosis Date  . Arthritis   . Depressive disorder, not elsewhere classified   . Other and unspecified hyperlipidemia   . Unspecified essential hypertension   . Sleep apnea     cpap  . Spastic dysuria   . Hyperactivity of bladder   . Organic impotence   . Degenerative disc disease    No past surgical history on file. History   Social History  . Marital Status: Single    Spouse Name: N/A    Number of Children: 0  . Years of Education: Tech sch   Occupational History  . Arts administrator   .  Unemployed   Social History Main Topics  . Smoking status: Former Smoker    Types: Cigarettes    Quit date: 07/27/2006  . Smokeless tobacco: Never Used  . Alcohol Use: 3.0 oz/week    5 Cans of beer per week  . Drug Use: No  . Sexual Activity: Not on file   Other Topics Concern  . Not on file   Social History Narrative   Patient lives at home with his mother.   Caffeine  Use: 2 cups of coffee daily and some soda   Family History  Problem Relation Age of Onset  . Uterine cancer Sister   . Cancer Sister     uterine  . Heart disease Father   . Colon cancer Neg Hx   .  Stomach cancer Neg Hx    Current Outpatient Prescriptions on File Prior to Visit  Medication Sig Dispense Refill  . aspirin 81 MG tablet Take 81 mg by mouth daily.        Marland Kitchen atorvastatin (LIPITOR) 40 MG tablet Take 1 tablet (40 mg total) by mouth daily.  30 tablet  5  . Cholecalciferol (VITAMIN D) 2000 UNITS CAPS Take 1 capsule by mouth daily.         No current facility-administered medications on file prior to visit.   No Known Allergies Immunization History  Administered Date(s) Administered  . Influenza Split 05/28/2011  . Influenza,inj,Quad PF,36+ Mos 04/07/2013  . Tdap 08/07/2006   Prior to Admission medications   Medication Sig Start Date End Date Taking? Authorizing Provider  aspirin 81 MG tablet Take 81 mg by mouth daily.     Yes Historical Provider, MD  atorvastatin (LIPITOR) 40 MG tablet Take 1 tablet (40 mg total) by mouth daily. 04/12/13  Yes Vernie Shanks, MD  BENICAR 40 MG tablet Take 1 tablet by mouth  every day 06/06/13  Yes Vernie Shanks, MD  Cholecalciferol (VITAMIN D) 2000 UNITS CAPS Take 1 capsule by mouth daily.     Yes Historical Provider, MD  LOVAZA 1 G capsule Take 1 capsule by mouth 2 (two) times daily.  10/30/10  Historical Provider, MD     ROS: As above in the HPI. All other systems are stable or negative.  OBJECTIVE: APPEARANCE:  Patient in no acute distress.The patient appeared well nourished and normally developed. Acyanotic. Waist:46 inches VITAL SIGNS:BP 120/58  Pulse 72  Temp(Src) 98.2 F (36.8 C) (Oral)  Ht _0  (1.803 m)  Wt 227 lb 9.6 oz (103.239 kg)  BMI 31.76 kg/m2 Obese WM  SKIN: warm and  Dry without overt rashes, tattoos and scars  HEAD and Neck: without JVD, Head and scalp: normal Eyes:No scleral icterus. Fundi normal, eye movements normal. Ears: Auricle normal, canal normal, Tympanic membranes normal, insufflation normal. Nose: normal Throat: normal Neck & thyroid: normal  CHEST & LUNGS: Chest wall: normal Lungs:  Clear  CVS: Reveals the PMI to be normally located. Regular rhythm, First and Second Heart sounds are normal,  absence of murmurs, rubs or gallops. Peripheral vasculature: Radial pulses: normal Dorsal pedis pulses: normal Posterior pulses: normal  ABDOMEN:  Appearance: Obese centrally Benign, no organomegaly, no masses, no Abdominal Aortic enlargement. No Guarding , no rebound. No Bruits. Bowel sounds: normal  RECTAL: N/A GU: N/A  EXTREMETIES: nonedematous.  MUSCULOSKELETAL:  Spine: normal Joints: intact  NEUROLOGIC: oriented to time,place and person; nonfocal. Strength is normal Sensory is normal Reflexes are normal Cranial Nerves are normal. Results for orders placed in visit on 07/05/13  CMP14+EGFR      Result Value Range   Glucose 103 (*) 65 - 99 mg/dL   BUN 13  8 - 27 mg/dL   Creatinine, Ser 0.97  0.76 - 1.27 mg/dL   GFR calc non Af Amer 84  >59 mL/min/1.73   GFR calc Af Amer 98  >59 mL/min/1.73   BUN/Creatinine Ratio 13  10 - 22   Sodium 144  134 - 144 mmol/L   Potassium 4.6  3.5 - 5.2 mmol/L   Chloride 103  97 - 108 mmol/L   CO2 24  18 - 29 mmol/L   Calcium 9.3  8.6 - 10.2 mg/dL   Total Protein 6.3  6.0 - 8.5 g/dL   Albumin 4.3  3.6 - 4.8 g/dL   Globulin, Total 2.0  1.5 - 4.5 g/dL   Albumin/Globulin Ratio 2.2  1.1 - 2.5   Total Bilirubin 0.6  0.0 - 1.2 mg/dL   Alkaline Phosphatase 75  39 - 117 IU/L   AST 28  0 - 40 IU/L   ALT 39  0 - 44 IU/L  NMR, LIPOPROFILE      Result Value Range   LDL Particle Number [id number]  <1000 nmol/L   LDLC SERPL CALC-MCNC 65  <100 mg/dL   HDL Cholesterol by NMR 52  >=40 mg/dL   Triglycerides by NMR 62  <150 mg/dL   Cholesterol 129  <200 mg/dL   HDL Particle Number 39.6  >=30.5 umol/L   Small LDL Particle Number 387  <=527 nmol/L   LDL Size 20.5 (*) >20.5 nm   LP-IR Score 70 (*) <=45    ASSESSMENT:  Unspecified essential hypertension - Plan: olmesartan (BENICAR) 40 MG tablet  Hyperglycemia - Plan: POCT glycosylated  hemoglobin (Hb A1C)  HLD (hyperlipidemia) - Plan: CMP14+EGFR, NMR, lipoprofile  Degenerative disc disease, lumbar  Overactive bladder  Mixed sleep apnea  Obesity, unspecified Metabolic  Syndrome  PLAN: discussed metabolic  Syndrome. Discussed his IR=insulin resistance score on his Lipomed panel.   Discussed need for a plant based diet and exercise.  He has obtained the nutrition books  Recommended:  Dr Paula Libra Recommendations  For nutrition information, I recommend books:  1).Eat to Live by Dr Excell Seltzer. 2).Prevent and Reverse Heart Disease by Dr Karl Luke. 3) Dr Janene Harvey Book:  Program to Reverse Diabetes   Orders Placed This Encounter  Procedures  . CMP14+EGFR    Standing Status: Future     Number of Occurrences:      Standing Expiration Date: 11/12/2014    Order Specific Question:  Has the patient fasted?    Answer:  Yes  . NMR, lipoprofile    Standing Status: Future     Number of Occurrences:      Standing Expiration Date: 07/14/2014  . POCT glycosylated hemoglobin (Hb A1C)    Standing Status: Future     Number of Occurrences:      Standing Expiration Date: 11/11/2013   Meds ordered this encounter  Medications  . olmesartan (BENICAR) 40 MG tablet    Sig: Take 1 tablet by mouth  every day    Dispense:  90 tablet    Refill:  3   Medications Discontinued During This Encounter  Medication Reason  . LOVAZA 1 G capsule Patient has not taken in last 30 days  . BENICAR 40 MG tablet Reorder   Return in about 3 months (around 10/11/2013) for Recheck medical problems.  Kymani Laursen P. Jacelyn Grip, M.D.

## 2013-09-15 ENCOUNTER — Encounter: Payer: Self-pay | Admitting: *Deleted

## 2013-09-28 ENCOUNTER — Ambulatory Visit: Payer: 59 | Admitting: Neurology

## 2013-10-02 ENCOUNTER — Other Ambulatory Visit (INDEPENDENT_AMBULATORY_CARE_PROVIDER_SITE_OTHER): Payer: 59

## 2013-10-02 DIAGNOSIS — R7309 Other abnormal glucose: Secondary | ICD-10-CM

## 2013-10-02 DIAGNOSIS — E785 Hyperlipidemia, unspecified: Secondary | ICD-10-CM

## 2013-10-02 DIAGNOSIS — R739 Hyperglycemia, unspecified: Secondary | ICD-10-CM

## 2013-10-02 LAB — POCT GLYCOSYLATED HEMOGLOBIN (HGB A1C): Hemoglobin A1C: 5.6

## 2013-10-02 NOTE — Progress Notes (Signed)
Patient came in for labs only.

## 2013-10-03 LAB — CMP14+EGFR
ALT: 22 IU/L (ref 0–44)
AST: 19 IU/L (ref 0–40)
Albumin/Globulin Ratio: 2.4 (ref 1.1–2.5)
Albumin: 4.4 g/dL (ref 3.6–4.8)
Alkaline Phosphatase: 71 IU/L (ref 39–117)
BUN/Creatinine Ratio: 11 (ref 10–22)
BUN: 11 mg/dL (ref 8–27)
CO2: 24 mmol/L (ref 18–29)
Calcium: 9.2 mg/dL (ref 8.6–10.2)
Chloride: 100 mmol/L (ref 97–108)
Creatinine, Ser: 1 mg/dL (ref 0.76–1.27)
GFR calc Af Amer: 94 mL/min/{1.73_m2} (ref >59)
GFR calc non Af Amer: 81 mL/min/{1.73_m2} (ref >59)
Globulin, Total: 1.8 g/dL (ref 1.5–4.5)
Glucose: 103 mg/dL — ABNORMAL HIGH (ref 65–99)
Potassium: 4.7 mmol/L (ref 3.5–5.2)
Sodium: 141 mmol/L (ref 134–144)
Total Bilirubin: 0.6 mg/dL (ref 0.0–1.2)
Total Protein: 6.2 g/dL (ref 6.0–8.5)

## 2013-10-03 LAB — NMR, LIPOPROFILE
Cholesterol: 136 mg/dL (ref <200)
HDL Cholesterol by NMR: 57 mg/dL (ref >=40)
HDL Particle Number: 40.3 umol/L (ref >=30.5)
LDL Particle Number: 806 nmol/L (ref <1000)
LDL Size: 20.8 nm (ref >20.5)
LDLC SERPL CALC-MCNC: 66 mg/dL (ref <100)
LP-IR Score: 64 — ABNORMAL HIGH (ref <=45)
Small LDL Particle Number: 323 nmol/L (ref <=527)
Triglycerides by NMR: 64 mg/dL (ref <150)

## 2013-10-09 ENCOUNTER — Encounter: Payer: Self-pay | Admitting: Family Medicine

## 2013-10-09 ENCOUNTER — Ambulatory Visit (INDEPENDENT_AMBULATORY_CARE_PROVIDER_SITE_OTHER): Payer: 59 | Admitting: Family Medicine

## 2013-10-09 VITALS — BP 123/74 | HR 76 | Temp 99.0°F | Ht 71.0 in | Wt 223.6 lb

## 2013-10-09 DIAGNOSIS — R202 Paresthesia of skin: Secondary | ICD-10-CM

## 2013-10-09 DIAGNOSIS — H9190 Unspecified hearing loss, unspecified ear: Secondary | ICD-10-CM

## 2013-10-09 DIAGNOSIS — I10 Essential (primary) hypertension: Secondary | ICD-10-CM

## 2013-10-09 DIAGNOSIS — E785 Hyperlipidemia, unspecified: Secondary | ICD-10-CM

## 2013-10-09 DIAGNOSIS — G4739 Other sleep apnea: Secondary | ICD-10-CM

## 2013-10-09 DIAGNOSIS — E669 Obesity, unspecified: Secondary | ICD-10-CM

## 2013-10-09 DIAGNOSIS — M5137 Other intervertebral disc degeneration, lumbosacral region: Secondary | ICD-10-CM

## 2013-10-09 DIAGNOSIS — N318 Other neuromuscular dysfunction of bladder: Secondary | ICD-10-CM

## 2013-10-09 DIAGNOSIS — M5136 Other intervertebral disc degeneration, lumbar region: Secondary | ICD-10-CM

## 2013-10-09 DIAGNOSIS — R209 Unspecified disturbances of skin sensation: Secondary | ICD-10-CM

## 2013-10-09 DIAGNOSIS — H9192 Unspecified hearing loss, left ear: Secondary | ICD-10-CM

## 2013-10-09 DIAGNOSIS — N3281 Overactive bladder: Secondary | ICD-10-CM

## 2013-10-09 DIAGNOSIS — M51369 Other intervertebral disc degeneration, lumbar region without mention of lumbar back pain or lower extremity pain: Secondary | ICD-10-CM

## 2013-10-09 MED ORDER — ATORVASTATIN CALCIUM 40 MG PO TABS: 40.0000 mg | ORAL_TABLET | Freq: Every day | ORAL | Status: DC
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
## 2013-10-09 NOTE — Patient Instructions (Signed)
DASH Diet The DASH diet stands for "Dietary Approaches to Stop Hypertension." It is a healthy eating plan that has been shown to reduce high blood pressure (hypertension) in as little as 14 days, while also possibly providing other significant health benefits. These other health benefits include reducing the risk of breast cancer after menopause and reducing the risk of type 2 diabetes, heart disease, colon cancer, and stroke. Health benefits also include weight loss and slowing kidney failure in patients with chronic kidney disease.  DIET GUIDELINES  Limit salt (sodium). Your diet should contain less than 1500 mg of sodium daily.  Limit refined or processed carbohydrates. Your diet should include mostly whole grains. Desserts and added sugars should be used sparingly.  Include small amounts of heart-healthy fats. These types of fats include nuts, oils, and tub margarine. Limit saturated and trans fats. These fats have been shown to be harmful in the body. CHOOSING FOODS  The following food groups are based on a 2000 calorie diet. See your Registered Dietitian for individual calorie needs. Grains and Grain Products (6 to 8 servings daily)  Eat More Often: Whole-wheat bread, brown rice, whole-grain or wheat pasta, quinoa, popcorn without added fat or salt (air popped).  Eat Less Often: White bread, white pasta, white rice, cornbread. Vegetables (4 to 5 servings daily)  Eat More Often: Fresh, frozen, and canned vegetables. Vegetables may be raw, steamed, roasted, or grilled with a minimal amount of fat.  Eat Less Often/Avoid: Creamed or fried vegetables. Vegetables in a cheese sauce. Fruit (4 to 5 servings daily)  Eat More Often: All fresh, canned (in natural juice), or frozen fruits. Dried fruits without added sugar. One hundred percent fruit juice ( cup [237 mL] daily).  Eat Less Often: Dried fruits with added sugar. Canned fruit in light or heavy syrup. [employer], Fish, and Poultry (2  servings or less daily. One serving is 3 to 4 oz [85-114 g]).  Eat More Often: Ninety percent or leaner ground beef, tenderloin, sirloin. Round cuts of beef, chicken breast, [place] breast. All fish. Grill, bake, or broil your meat. Nothing should be fried.  Eat Less Often/Avoid: Fatty cuts of meat, [place], or chicken leg, thigh, or wing. Fried cuts of meat or fish. Dairy (2 to 3 servings)  Eat More Often: Low-fat or fat-free milk, low-fat plain or light yogurt, reduced-fat or part-skim cheese.  Eat Less Often/Avoid: Milk (whole, 2%).Whole milk yogurt. Full-fat cheeses. Nuts, Seeds, and Legumes (4 to 5 servings per week)  Eat More Often: All without added salt.  Eat Less Often/Avoid: Salted nuts and seeds, canned beans with added salt. Fats and Sweets (limited)  Eat More Often: Vegetable oils, tub margarines without trans fats, sugar-free gelatin. Mayonnaise and salad dressings.  Eat Less Often/Avoid: Coconut oils, palm oils, butter, stick margarine, cream, half and half, cookies, candy, pie. FOR MORE INFORMATION The Dash Diet Eating Plan: www.dashdiet.org Document Released: 05/14/2011 Document Revised: 08/17/2011 Document Reviewed: 05/14/2011 [facility] Patient Information 2014 [place], [place].        Dr Paula Libra Recommendations  For nutrition information, I recommend books:  1).Eat to Live by Dr Excell Seltzer. 2).Prevent and Reverse Heart Disease by Dr Karl Luke. 3) Dr Janene Harvey Book:  Program to Reverse Diabetes  Exercise recommendations are:  If unable to walk, then the patient can exercise in a chair 3 times a day. By flapping arms like a bird gently and raising legs outwards to the front.  If ambulatory, the patient can go for  walks for 30 minutes 3 times a week. Then increase the intensity and duration as tolerated.  Goal is to try to attain exercise frequency to 5 times a week.  If applicable: Best to perform resistance exercises (machines or  weights) 2 days a week and cardio type exercises 3 days per week.

## 2013-10-13 ENCOUNTER — Ambulatory Visit: Payer: 59 | Admitting: Family Medicine

## 2014-06-13 ENCOUNTER — Other Ambulatory Visit: Payer: Self-pay

## 2014-06-13 DIAGNOSIS — I10 Essential (primary) hypertension: Secondary | ICD-10-CM

## 2014-11-20 ENCOUNTER — Encounter: Payer: Self-pay | Admitting: Gastroenterology

## 2014-12-03 ENCOUNTER — Other Ambulatory Visit: Payer: Self-pay

## 2016-07-08 ENCOUNTER — Encounter: Payer: Self-pay | Admitting: Gastroenterology

## 2017-07-28 ENCOUNTER — Encounter: Payer: Self-pay | Admitting: Physician Assistant

## 2017-08-09 ENCOUNTER — Encounter: Payer: Self-pay | Admitting: *Deleted

## 2017-08-10 ENCOUNTER — Ambulatory Visit: Payer: 59 | Admitting: Physician Assistant

## 2017-08-10 ENCOUNTER — Encounter: Payer: Self-pay | Admitting: Physician Assistant

## 2017-08-10 VITALS — BP 130/78 | HR 68 | Ht 71.0 in | Wt 225.0 lb

## 2017-08-10 DIAGNOSIS — Z1211 Encounter for screening for malignant neoplasm of colon: Secondary | ICD-10-CM | POA: Diagnosis not present

## 2017-08-10 DIAGNOSIS — R1032 Left lower quadrant pain: Secondary | ICD-10-CM

## 2017-08-10 DIAGNOSIS — R194 Change in bowel habit: Secondary | ICD-10-CM

## 2017-08-10 DIAGNOSIS — Z86010 Personal history of colonic polyps: Secondary | ICD-10-CM

## 2017-08-10 DIAGNOSIS — R1033 Periumbilical pain: Secondary | ICD-10-CM

## 2017-08-10 MED ORDER — NA SULFATE-K SULFATE-MG SULF 17.5-3.13-1.6 GM/177ML PO SOLN: 1.0000 | Freq: Once | ORAL | 0 refills | Status: AC

## 2017-08-10 NOTE — Patient Instructions (Addendum)
You have been scheduled for a colonoscopy. Please follow written instructions given to you at your visit today.  Please pick up your prep supplies at the pharmacy within the next 1-3 days. [employer]. If you use inhalers (even only as needed), please bring them with you on the day of your procedure. Your physician has requested that you go to www.startemmi.com and enter the access code given to you at your visit today. This web site gives a general overview about your procedure. However, you should still follow specific instructions given to you by our office regarding your preparation for the procedure.  If you are age 65 or older, your body mass index should be between 23-30. Your Body mass index is 31.38 kg/m. If this is out of the aforementioned range listed, please consider follow up with your Primary Care Provider.  If you are age 65 or younger, your body mass index should be between 19-25. Your Body mass index is 31.38 kg/m. If this is out of the aformentioned range listed, please consider follow up with your Primary Care Provider.     You have been scheduled for a CT scan of the abdomen and pelvis at [facility] ([address]---this is in the same building as Press [facility]).   You are scheduled on Tuesday 3-12--2019 at 11:00 am. You should arrive at 10;45 am to your appointment time for registration. Please follow the written instructions below on the day of your exam:  WARNING: IF YOU ARE ALLERGIC TO IODINE/X-RAY DYE, PLEASE NOTIFY RADIOLOGY IMMEDIATELY AT [phone]! YOU WILL BE GIVEN A 13 HOUR PREMEDICATION PREP.  1) Do not eat  anything after 5:00 am (4 hours prior to your test) 2) You have been given 2 bottles of oral contrast to drink. The solution may taste               better if refrigerated, but do NOT add ice or any other liquid to this solution. Shake             well before drinking.    Drink 1 bottle of contrast @ 9:00 am (2 hours prior to your  exam)  Drink 1 bottle of contrast @ 10:00 am (1 hour prior to your exam)  You may take any medications as prescribed with a small amount of water except for the following: Metformin, Glucophage, Glucovance, Avandamet, Riomet, Fortamet, Actoplus Met, Janumet, Glumetza or Metaglip. The above medications must be held the day of the exam AND 48 hours after the exam.  The purpose of you drinking the oral contrast is to aid in the visualization of your intestinal tract. The contrast solution may cause some diarrhea. Before your exam is started, you will be given a small amount of fluid to drink. Depending on your individual set of symptoms, you may also receive an intravenous injection of x-ray contrast/dye. Plan on being at [facility] for 30 minutes or long, depending on the type of exam you are having performed.  If you have any questions regarding your exam or if you need to reschedule, you may call the CT department at [phone] between the hours of 8:00 am and 5:00 pm, Monday-Friday.  ________________________________________________________________________

## 2017-08-11 ENCOUNTER — Encounter: Payer: Self-pay | Admitting: Physician Assistant

## 2017-08-11 NOTE — Progress Notes (Signed)
Subjective:    Patient ID: Daniel Frank, male    DOB: 16-Jan-1951, 67 y.o.   MRN: [id number]  HPI Daniel Frank is a pleasant 67 year old white male, known to Dr. Fuller Plan who was last seen here in 2013 when he had colonoscopy. He is referred today by Dr. Joaquim Lai Wong/PCP for evaluation of new complaints of abdominal pain and change in bowel habits. Patient says his current symptoms have been present over the past 2 months, somewhat worse over the past couple of weeks. He had an episode this past weekend where he was kept up most of the night with lower abdominal discomfort. He describes it as a dull hurting. He says the discomfort for the most part has been coming and going and yesterday he actually felt okay. He has had some associated constipation and feels that is having stools somewhat smaller in diameter. Has not noted any melena or hematochezia. Appetite has been okay, weight has been stable. He has not noted any change in abdominal discomfort postprandially or with bowel movements. No fever or chills. He does mention that he's been under a lot of stress. 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 He mentions that he has other family members who are not involved. No prior abdominal surgery. No regular aspirin or NSAID use. Labs done February 2019 with normal CBC, chemistries and stool cultures.  Last colonoscopy March 2013 done for history of adenomatous colon polyps with finding of one 5 mm polyp in the ascending colon and otherwise normal exam. There was no diverticulosis noted. Path on the polyp showed polypoid colorectal mucosa no adenomatous change.   Review of Systems;Pertinent positive and negative review of systems were noted in the above HPI section.  All other review of systems was otherwise negative.  Outpatient Encounter Medications as of 08/10/2017  Medication Sig  . atorvastatin (LIPITOR) 40 MG tablet Take 1 tablet (40 mg total) by mouth daily.  .  Multiple Vitamin (MULTIVITAMIN) tablet Take 1 tablet by mouth daily.  Marland Kitchen olmesartan (BENICAR) 40 MG tablet Take 1 tablet by mouth  every day  . [EXPIRED] Na Sulfate-K Sulfate-Mg Sulf 17.5-3.13-1.6 GM/177ML SOLN Take 1 kit by mouth once for 1 dose.  . [DISCONTINUED] aspirin 81 MG tablet Take 81 mg by mouth daily.    . [DISCONTINUED] Cholecalciferol (VITAMIN D) 2000 UNITS CAPS Take 1 capsule by mouth daily.     No facility-administered encounter medications on file as of 08/10/2017.    No Known Allergies Patient Active Problem List   Diagnosis Date Noted  . Need for prophylactic vaccination and inoculation against influenza 04/07/2013  . HLD (hyperlipidemia) 01/03/2013  . Paresthesias 01/03/2013  . Unspecified essential hypertension 09/02/2012  . Other and unspecified hyperlipidemia 09/02/2012  . Degenerative disc disease, lumbar 09/02/2012  . Overactive bladder 09/02/2012  . Obesity, unspecified 09/02/2012  . Hyperglycemia 09/02/2012  . Neck pain on left side 09/02/2012  . Nocturia 12/21/2010  . Mixed sleep apnea 10/02/2010  . PERSONAL HX COLONIC POLYPS 06/14/2008   Social History   Socioeconomic History  . Marital status: Single    Spouse name: Not on file  . Number of children: 0  . Years of education: Tech sch  . Highest education level: Not on file  Social Needs  . Financial resource strain: Not on file  . Food insecurity - worry: Not on file  . Food insecurity - inability: Not on file  . Transportation needs -  medical: Not on file  . Transportation needs - non-medical: Not on file  Occupational History  . Occupation: Runner, broadcasting/film/video: UNEMPLOYED  Tobacco Use  . Smoking status: Former Smoker    Types: Cigarettes    Last attempt to quit: 07/27/2006    Years since quitting: 11.0  . Smokeless tobacco: Never Used  Substance and Sexual Activity  . Alcohol use: Yes    Alcohol/week: 3.0 oz    Types: 5 Cans of beer per week  . Drug use: No  . Sexual activity:  Not on file  Other Topics Concern  . Not on file  Social History Narrative   Patient lives at home with his mother.   Caffeine  Use: 2 cups of coffee daily and some soda    Mr. Busche family history includes Cancer in his sister; Heart disease in his father; Uterine cancer in his sister.      Objective:    Vitals:   08/10/17 0952  BP: 130/78  Pulse: 68    Physical Exam ; well-developed white male in no acute distress, pleasant blood pressure 130/78, pulse 68, height 5 foot 11, weight 225, BMI 31.3. HEENT; nontraumatic normocephalic EOMI PERRLA sclera anicteric, Cardiovascular; regular rate and rhythm with S1-S2 no murmur or gallop, Pulmonary; clear bilaterally Abdomen; soft, bowel sounds are present, there is some tenderness in the Peri umbilical area and below the umbilicus, no guarding or rebound no palpable mass or hepatosplenomegaly, Rectal; exam not done, Extremities; no clubbing cyanosis or edema skin warm and dry, Neuropsych ;mood and affect appropriate       Assessment & Plan:   #21 67 year old white male with 2 month history of. Umbilical and lower abdominal pain, dull in nature and associated with some change in bowel habits with constipation. Etiology of his symptoms is not clear, prior colonoscopy did not show any evidence of diverticulosis. #2 history of adenomatous colon polyps-colonoscopy last done in March 2013, no adenomatous polyps but does have history of adenomatous polyps and is overdue for follow-up. #3 hearing loss-patient has just obtained hearing aids and is very pleased. #4 hypertension #5 obstructive sleep apnea-no oxygen use #6 osteoarthritis  Plan; schedule for CT of the abdomen and pelvis with contrast. Will also schedule for colonoscopy with Dr. Fuller Plan. Procedure was discussed in detail with patient including indications risks and benefits and he is agreeable to proceed. He will use Maalox on an as-needed basis for mild constipation. Further plans  pending results of CT. If colonoscopy and CT scan are both unrevealing,, would refer back to Dr. Jacelyn Grip for management of anxiety/depression.  Dionisia Pacholski S Moon Budde PA-C 08/11/2017   Cc: Vernie Shanks, MD

## 2017-08-11 NOTE — Progress Notes (Signed)
Reviewed and agree with initial management plan.  Abria Vannostrand T. Ioana Louks, MD FACG 

## 2017-08-17 ENCOUNTER — Ambulatory Visit (INDEPENDENT_AMBULATORY_CARE_PROVIDER_SITE_OTHER)
Admission: RE | Admit: 2017-08-17 | Discharge: 2017-08-17 | Disposition: A | Discharge status: Home / Self Care | Payer: 59 | Source: Ambulatory Visit | Attending: Physician Assistant | Admitting: Physician Assistant

## 2017-08-17 DIAGNOSIS — R1032 Left lower quadrant pain: Secondary | ICD-10-CM

## 2017-08-17 DIAGNOSIS — R194 Change in bowel habit: Secondary | ICD-10-CM

## 2017-08-17 MED ORDER — IOPAMIDOL (ISOVUE-300) INJECTION 61%
100.0000 mL | Freq: Once | INTRAVENOUS | Status: AC | PRN
  Administered 2017-08-17: 100 mL via INTRAVENOUS

## 2017-08-25 ENCOUNTER — Ambulatory Visit: Payer: Self-pay | Admitting: Physician Assistant

## 2017-09-08 ENCOUNTER — Encounter: Payer: Self-pay | Admitting: Gastroenterology

## 2017-09-20 ENCOUNTER — Ambulatory Visit (AMBULATORY_SURGERY_CENTER): Payer: 59 | Admitting: Gastroenterology

## 2017-09-20 ENCOUNTER — Other Ambulatory Visit: Payer: Self-pay

## 2017-09-20 ENCOUNTER — Encounter: Payer: Self-pay | Admitting: Gastroenterology

## 2017-09-20 VITALS — BP 116/64 | HR 60 | Temp 99.3°F | Resp 17 | Ht 71.0 in | Wt 225.0 lb

## 2017-09-20 DIAGNOSIS — Z86010 Personal history of colonic polyps: Secondary | ICD-10-CM

## 2017-09-20 DIAGNOSIS — D123 Benign neoplasm of transverse colon: Secondary | ICD-10-CM

## 2017-09-20 DIAGNOSIS — R194 Change in bowel habit: Secondary | ICD-10-CM

## 2017-09-20 HISTORY — PX: COLONOSCOPY: SHX174

## 2017-09-20 MED ORDER — SODIUM CHLORIDE 0.9 % IV SOLN: 500.0000 mL | Freq: Once | INTRAVENOUS | Status: DC

## 2017-09-20 NOTE — Progress Notes (Signed)
To PACU, VSS. Report to [email or web address] 

## 2017-09-20 NOTE — Progress Notes (Signed)
Pt. Reports no change in his medical or surgical history since his pre-visit 08/10/2017.

## 2017-09-20 NOTE — Patient Instructions (Signed)
Handouts given to patient on polyps, diverticulosis and hemorrhoids  YOU HAD AN ENDOSCOPIC PROCEDURE TODAY AT [facility]:   Refer to the procedure report that was given to you for any specific questions about what was found during the examination.  If the procedure report does not answer your questions, please call your gastroenterologist to clarify.  If you requested that your care partner not be given the details of your procedure findings, then the procedure report has been included in a sealed envelope for you to review at your convenience later.  YOU SHOULD EXPECT: Some feelings of bloating in the abdomen. Passage of more gas than usual.  Walking can help get rid of the air that was put into your GI tract during the procedure and reduce the bloating. If you had a lower endoscopy (such as a colonoscopy or flexible sigmoidoscopy) you may notice spotting of blood in your stool or on the toilet paper. If you underwent a bowel prep for your procedure, you may not have a normal bowel movement for a few days.  Please Note:  You might notice some irritation and congestion in your nose or some drainage.  This is from the oxygen used during your procedure.  There is no need for concern and it should clear up in a day or so.  SYMPTOMS TO REPORT IMMEDIATELY:   Following lower endoscopy (colonoscopy or flexible sigmoidoscopy):  Excessive amounts of blood in the stool  Significant tenderness or worsening of abdominal pains  Swelling of the abdomen that is new, acute  Fever of 100F or higher  For urgent or emergent issues, a gastroenterologist can be reached at any hour by calling [phone].   DIET:  We do recommend a small meal at first, but then you may proceed to your regular diet.  Drink plenty of fluids but you should avoid alcoholic beverages for 24 hours.  ACTIVITY:  You should plan to take it easy for the rest of today and you should NOT DRIVE or use heavy machinery until  tomorrow (because of the sedation medicines used during the test).    FOLLOW UP: Our staff will call the number listed on your records the next business day following your procedure to check on you and address any questions or concerns that you may have regarding the information given to you following your procedure. If we do not reach you, we will leave a message.  However, if you are feeling well and you are not experiencing any problems, there is no need to return our call.  We will assume that you have returned to your regular daily activities without incident.  If any biopsies were taken you will be contacted by phone or by letter within the next 1-3 weeks.  Please call us at [phone] if you have not heard about the biopsies in 3 weeks.    SIGNATURES/CONFIDENTIALITY: You and/or your care partner have signed paperwork which will be entered into your electronic medical record.  These signatures attest to the fact that that the information above on your After Visit Summary has been reviewed and is understood.  Full responsibility of the confidentiality of this discharge information lies with you and/or your care-partner.

## 2017-09-20 NOTE — Op Note (Signed)
[place] Patient Name: Daniel Frank Procedure Date: 09/20/2017 2:12 PM MRN: [id number] Endoscopist: Ladene Artist , MD Age: 67 Referring MD:  Date of Birth: [birth year] Gender: Male Account #: [id number] Procedure:                Colonoscopy Indications:              Surveillance: Personal history of adenomatous                            polyps on last colonoscopy > 5 years ago Medicines:                Monitored Anesthesia Care Procedure:                Pre-Anesthesia Assessment:                           - Prior to the procedure, a History and Physical                            was performed, and patient medications and                            allergies were reviewed. The patient's tolerance of                            previous anesthesia was also reviewed. The risks                            and benefits of the procedure and the sedation                            options and risks were discussed with the patient.                            All questions were answered, and informed consent                            was obtained. Prior Anticoagulants: The patient has                            taken no previous anticoagulant or antiplatelet                            agents. ASA Grade Assessment: II - A patient with                            mild systemic disease. After reviewing the risks                            and benefits, the patient was deemed in                            satisfactory condition to undergo the procedure.  After obtaining informed consent, the colonoscope                            was passed under direct vision. Throughout the                            procedure, the patient's blood pressure, pulse, and                            oxygen saturations were monitored continuously. The                            Colonoscope was introduced through the anus and                            advanced to the the cecum,  identified by                            appendiceal orifice and ileocecal valve. The                            ileocecal valve, appendiceal orifice, and rectum                            were photographed. The quality of the bowel                            preparation was good. The colonoscopy was performed                            without difficulty. The patient tolerated the                            procedure well. Scope In: 2:22:27 PM Scope Out: 2:35:37 PM Scope Withdrawal Time: 0 hours 10 minutes 36 seconds  Total Procedure Duration: 0 hours 13 minutes 10 seconds  Findings:                 The perianal and digital rectal examinations were                            normal.                           A 6 mm polyp was found in the transverse colon. The                            polyp was sessile. The polyp was removed with a                            cold snare. Resection and retrieval were complete.                           A few small-mouthed diverticula were found in the  left colon.                           Internal hemorrhoids were found during                            retroflexion. The hemorrhoids were small and Grade                            I (internal hemorrhoids that do not prolapse).                           The exam was otherwise without abnormality on                            direct and retroflexion views. Complications:            No immediate complications. Estimated blood loss:                            None. Estimated Blood Loss:     Estimated blood loss: none. Impression:               - One 6 mm polyp in the transverse colon, removed                            with a cold snare. Resected and retrieved.                           - Diverticulosis in the left colon.                           - Internal hemorrhoids.                           - The examination was otherwise normal on direct                            and  retroflexion views. Recommendation:           - Repeat colonoscopy in 5 years for surveillance.                           - Patient has a contact number available for                            emergencies. The signs and symptoms of potential                            delayed complications were discussed with the                            patient. Return to normal activities tomorrow.                            Written discharge instructions were provided to the  patient.                           - Resume previous diet.                           - Continue present medications.                           - Await pathology results. Ladene Artist, MD 09/20/2017 2:38:40 PM This report has been signed electronically.

## 2017-09-20 NOTE — Progress Notes (Signed)
Called to room to assist during endoscopic procedure.  Patient ID and intended procedure confirmed with present staff. Received instructions for my participation in the procedure from the performing physician.  

## 2017-09-21 ENCOUNTER — Telehealth: Payer: Self-pay

## 2017-09-21 NOTE — Telephone Encounter (Signed)
  Follow up Call-  Call back number 09/20/2017  Post procedure Call Back phone  # [phone]  Permission to leave phone message Yes  Some recent data might be hidden     Patient questions:  Do you have a fever, pain , or abdominal swelling? No. Pain Score  0 *  Have you tolerated food without any problems? Yes.    Have you been able to return to your normal activities? Yes.    Do you have any questions about your discharge instructions: Diet   No. Medications  No. Follow up visit  No.  Do you have questions or concerns about your Care? No.  Actions: * If pain score is 4 or above: No action needed, pain <4.

## 2017-09-24 ENCOUNTER — Encounter: Payer: Self-pay | Admitting: Gastroenterology

## 2017-09-30 ENCOUNTER — Encounter: Payer: 59 | Admitting: Gastroenterology

## 2018-10-28 IMAGING — CT CT ABD-PELV W/ CM
2 of 5 series · 17 of 46 positions shown, 19 images · IV contrast (ISOVUE 300)
Comparison: None.

CLINICAL DATA: Lower abdominal pain and left lower quadrant pain
over the last 8 weeks. Worst after eating.

EXAM:
CT ABDOMEN AND PELVIS WITH CONTRAST
TECHNIQUE: Multidetector CT imaging of the abdomen and pelvis was performed
using the standard protocol following bolus administration of
intravenous contrast.
CONTRAST:  100mL [id number] IOPAMIDOL ([id number]) INJECTION 61%

[Series 2: abd/pel w · axial · 0.83mm/px · z∈[-470,-15]mm · 14 of 103 slices shown, 16 images]
[im 6/103  soft-tissue]
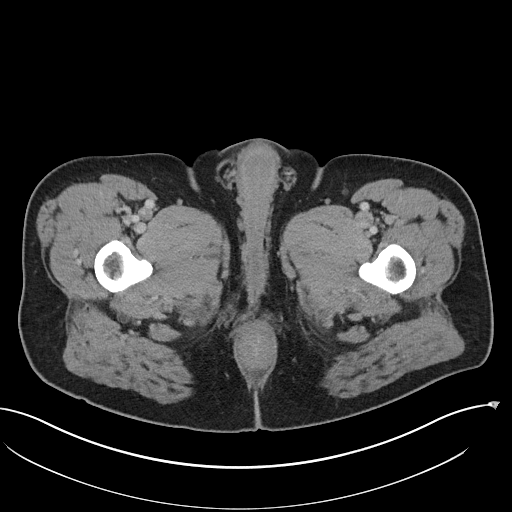
[im 6/103  bone]
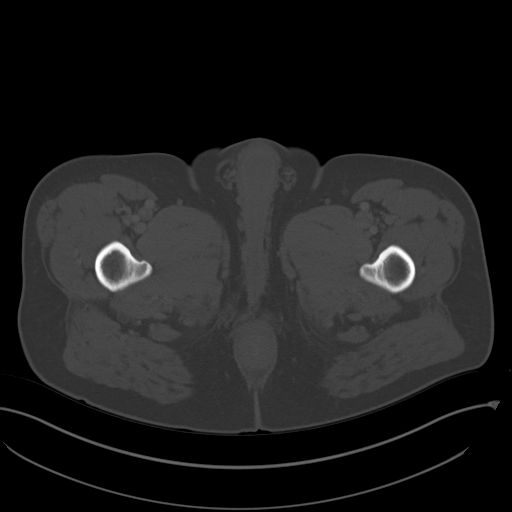
[im 11/103  soft-tissue]
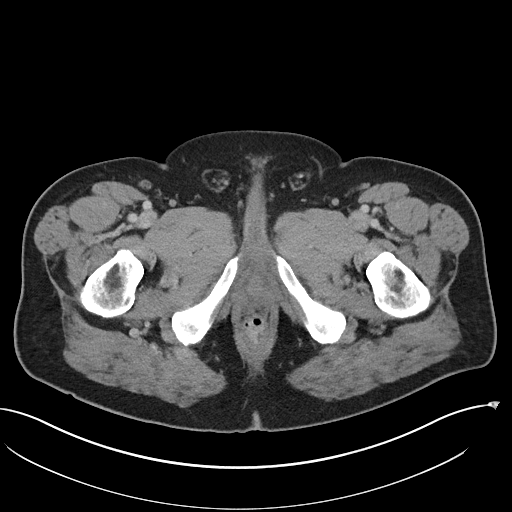
[im 22/103  soft-tissue]
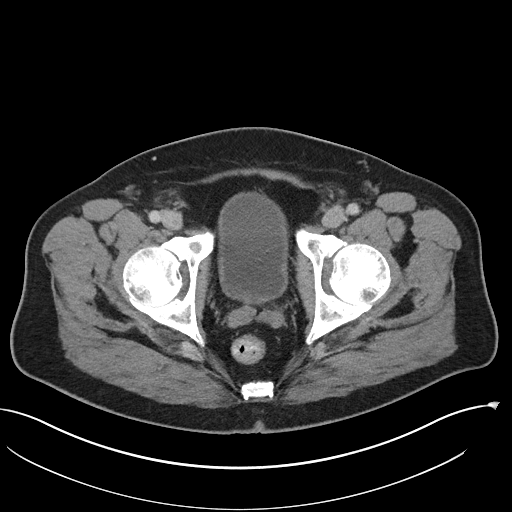
[im 27/103  soft-tissue]
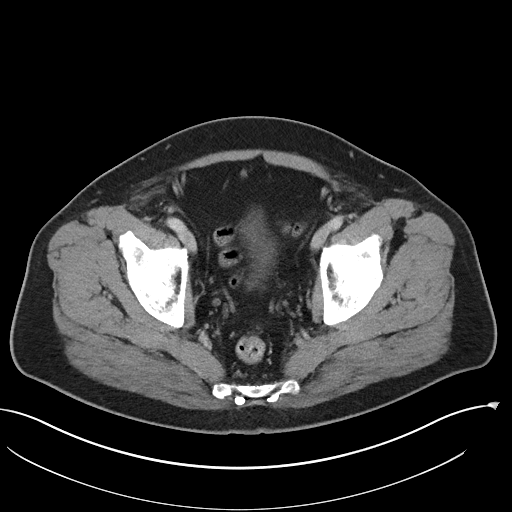
[im 33/103  soft-tissue]
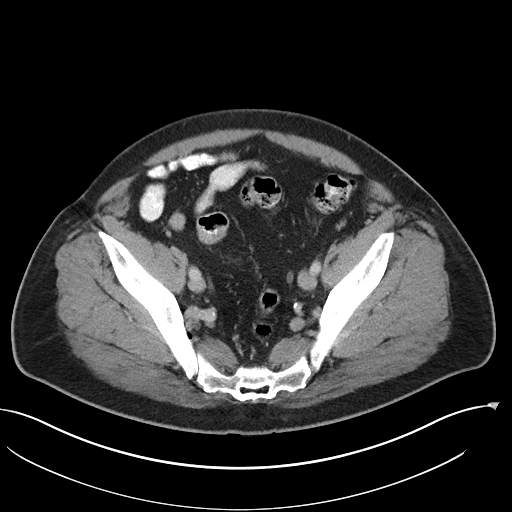
[im 43/103  soft-tissue]
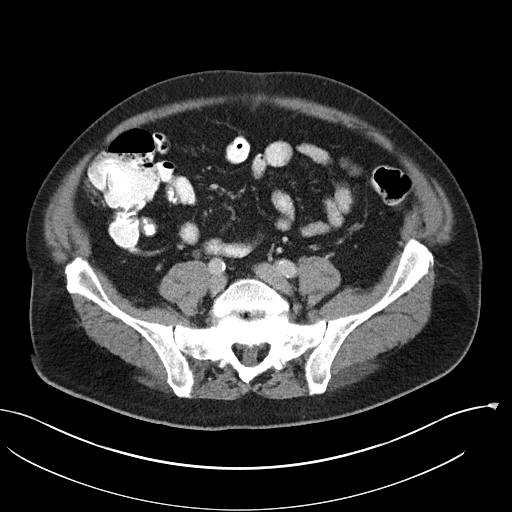
[im 49/103  soft-tissue]
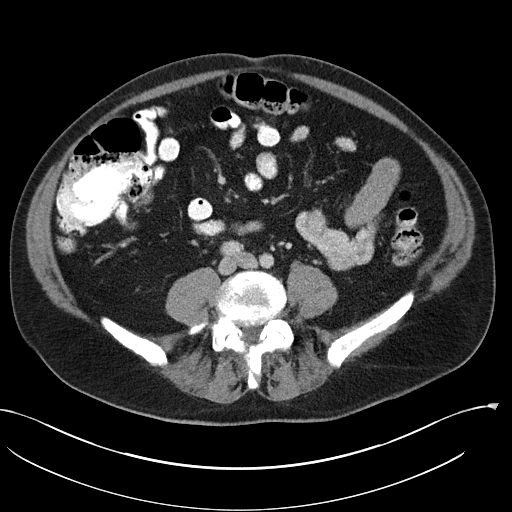
[im 54/103  soft-tissue]
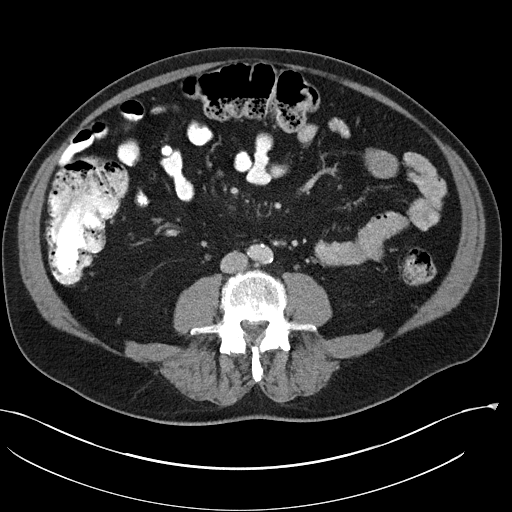
[im 60/103  soft-tissue]
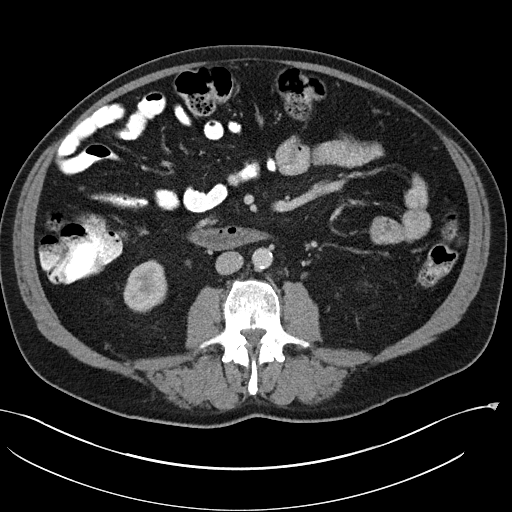
[im 60/103  bone]
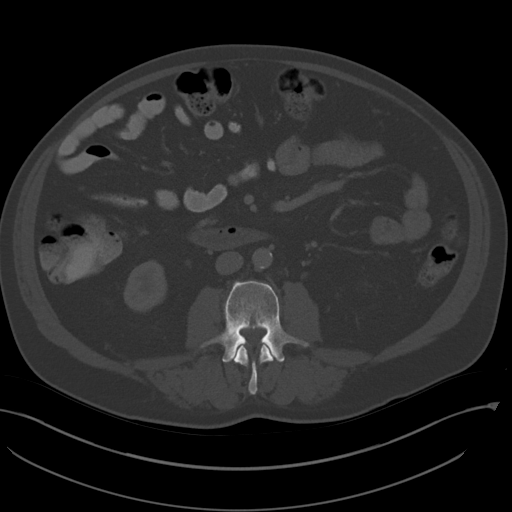
[im 70/103  soft-tissue]
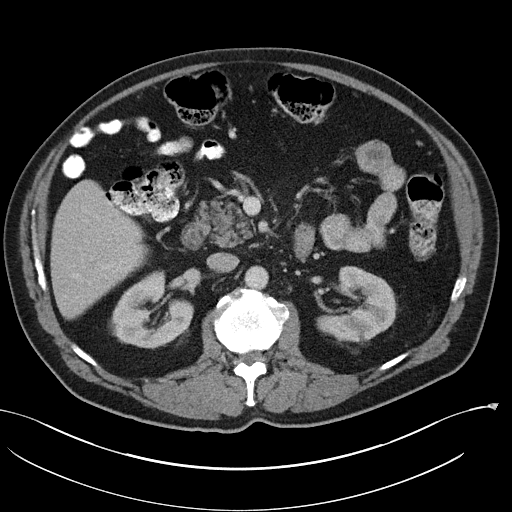
[im 76/103  soft-tissue]
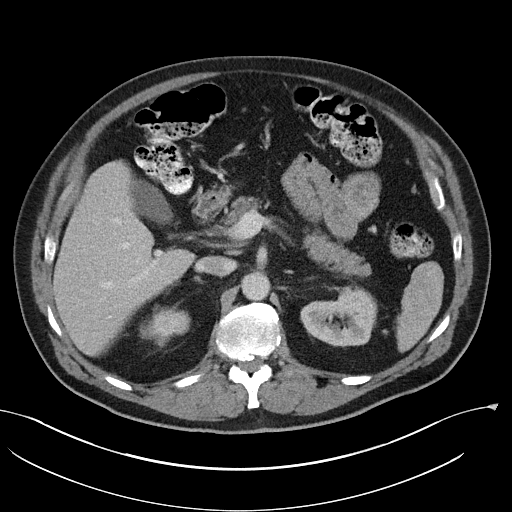
[im 81/103  soft-tissue]
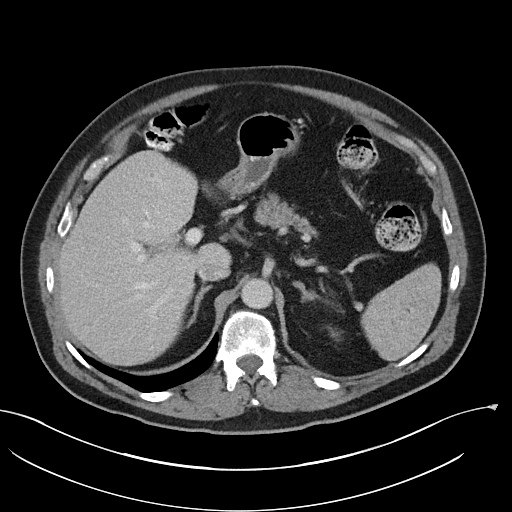
[im 92/103  soft-tissue]
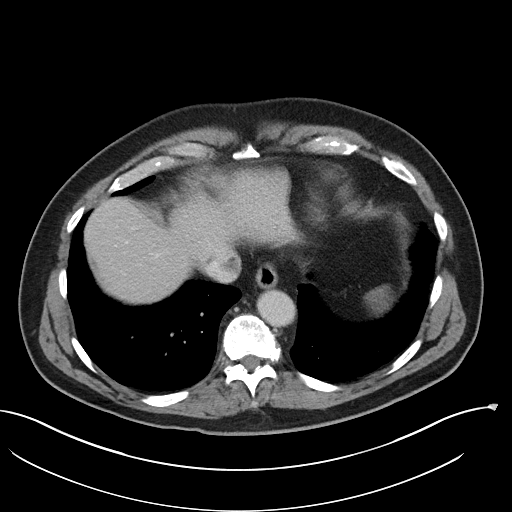
[im 97/103  soft-tissue]
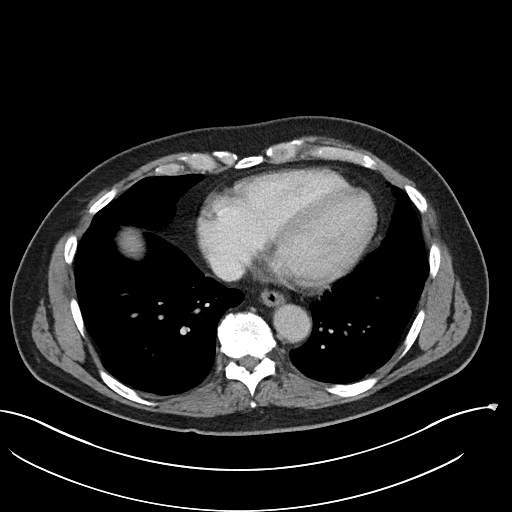

[Series 6: abd/pel w st · coronal · 0.88mm/px · 3 of 101 slices shown]
[im 34/101  soft-tissue]
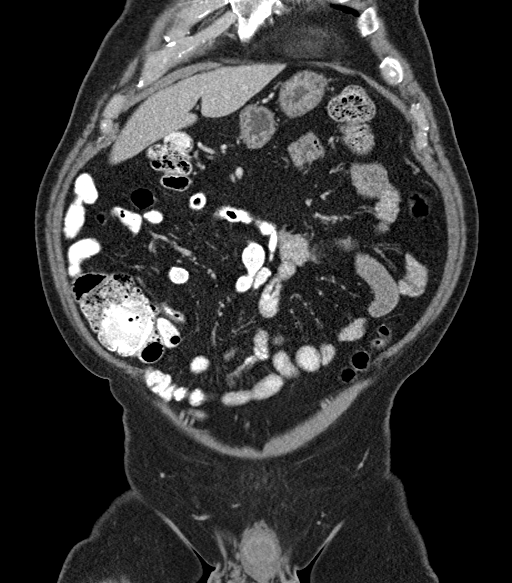
[im 45/101  soft-tissue]
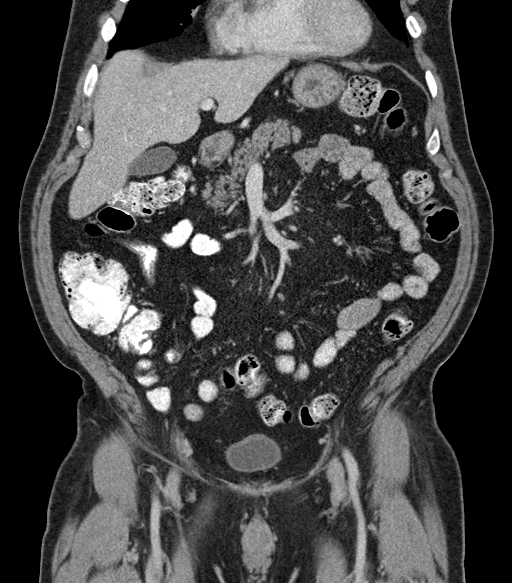
[im 56/101  soft-tissue]
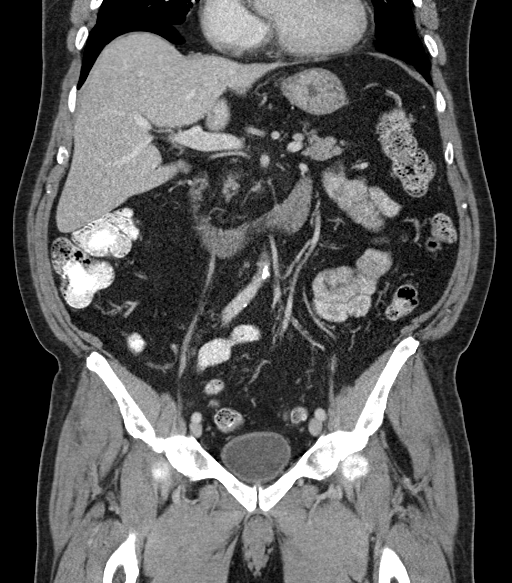

[17 of 46 positions shown; findings below may reference images not displayed]

FINDINGS: Lower chest: Normal

Hepatobiliary: Liver parenchyma is normal. No calcified gallstones.
No ductal dilatation.

Pancreas: Normal

Spleen: Normal

Adrenals/Urinary Tract: Adrenal glands are normal. Kidneys are
normal. No mass, stone or hydronephrosis. Bladder is normal.

Stomach/Bowel: Normal

Vascular/Lymphatic: Aortic atherosclerosis. No aneurysm. IVC is
normal. No retroperitoneal adenopathy or mass.

Reproductive: Normal

Other: No free fluid or air.

Musculoskeletal: Chronic degenerative changes affect the spine.
Chronic pars defects at L5 with 5 mm of anterolisthesis.
IMPRESSION: No cause of acute pain identified. Specifically, no evidence of
diverticulosis or diverticulitis.

Aortic atherosclerosis.

Bilateral pars defects at L5 with 5 mm anterolisthesis.

## 2018-12-29 ENCOUNTER — Other Ambulatory Visit: Payer: Self-pay

## 2018-12-29 ENCOUNTER — Emergency Department (HOSPITAL_COMMUNITY): Payer: Medicare Other

## 2018-12-29 ENCOUNTER — Emergency Department (HOSPITAL_COMMUNITY)
Admission: EM | Admit: 2018-12-29 | Discharge: 2018-12-29 | Disposition: A | Discharge status: Home / Self Care | Payer: Medicare Other | Attending: Emergency Medicine | Admitting: Emergency Medicine

## 2018-12-29 ENCOUNTER — Encounter (HOSPITAL_COMMUNITY): Payer: Self-pay | Admitting: Emergency Medicine

## 2018-12-29 DIAGNOSIS — F32A Depression, unspecified: Secondary | ICD-10-CM

## 2018-12-29 DIAGNOSIS — Z87891 Personal history of nicotine dependence: Secondary | ICD-10-CM | POA: Insufficient documentation

## 2018-12-29 DIAGNOSIS — R1030 Lower abdominal pain, unspecified: Secondary | ICD-10-CM | POA: Diagnosis present

## 2018-12-29 DIAGNOSIS — Z79899 Other long term (current) drug therapy: Secondary | ICD-10-CM | POA: Diagnosis not present

## 2018-12-29 DIAGNOSIS — Z658 Other specified problems related to psychosocial circumstances: Secondary | ICD-10-CM

## 2018-12-29 DIAGNOSIS — F329 Major depressive disorder, single episode, unspecified: Secondary | ICD-10-CM | POA: Diagnosis not present

## 2018-12-29 DIAGNOSIS — R45851 Suicidal ideations: Secondary | ICD-10-CM

## 2018-12-29 DIAGNOSIS — Z608 Other problems related to social environment: Secondary | ICD-10-CM | POA: Insufficient documentation

## 2018-12-29 DIAGNOSIS — I10 Essential (primary) hypertension: Secondary | ICD-10-CM | POA: Insufficient documentation

## 2018-12-29 DIAGNOSIS — R109 Unspecified abdominal pain: Secondary | ICD-10-CM

## 2018-12-29 DIAGNOSIS — R4589 Other symptoms and signs involving emotional state: Secondary | ICD-10-CM

## 2018-12-29 LAB — LIPASE, BLOOD: Lipase: 82 U/L — ABNORMAL HIGH (ref 11–51)

## 2018-12-29 LAB — CBC WITH DIFFERENTIAL/PLATELET
Abs Immature Granulocytes: 0.01 10*3/uL (ref 0.00–0.07)
Basophils Absolute: 0 10*3/uL (ref 0.0–0.1)
Basophils Relative: 1 %
Eosinophils Absolute: 0.1 10*3/uL (ref 0.0–0.5)
Eosinophils Relative: 2 %
HCT: 43.7 % (ref 39.0–52.0)
Hemoglobin: 14.8 g/dL (ref 13.0–17.0)
Immature Granulocytes: 0 %
Lymphocytes Relative: 26 %
Lymphs Abs: 1.4 10*3/uL (ref 0.7–4.0)
MCH: 32.7 pg (ref 26.0–34.0)
MCHC: 33.9 g/dL (ref 30.0–36.0)
MCV: 96.7 fL (ref 80.0–100.0)
Monocytes Absolute: 0.4 10*3/uL (ref 0.1–1.0)
Monocytes Relative: 8 %
Neutro Abs: 3.5 10*3/uL (ref 1.7–7.7)
Neutrophils Relative %: 63 %
Platelets: 156 10*3/uL (ref 150–400)
RBC: 4.52 MIL/uL (ref 4.22–5.81)
RDW: 12 % (ref 11.5–15.5)
WBC: 5.6 10*3/uL (ref 4.0–10.5)
nRBC: 0 % (ref 0.0–0.2)

## 2018-12-29 LAB — COMPREHENSIVE METABOLIC PANEL
ALT: 24 U/L (ref 0–44)
AST: 21 U/L (ref 15–41)
Albumin: 4.1 g/dL (ref 3.5–5.0)
Alkaline Phosphatase: 63 U/L (ref 38–126)
Anion gap: 7 (ref 5–15)
BUN: 13 mg/dL (ref 8–23)
CO2: 28 mmol/L (ref 22–32)
Calcium: 9.1 mg/dL (ref 8.9–10.3)
Chloride: 106 mmol/L (ref 98–111)
Creatinine, Ser: 1 mg/dL (ref 0.61–1.24)
GFR calc Af Amer: >60 mL/min (ref >60)
GFR calc non Af Amer: >60 mL/min (ref >60)
Glucose, Bld: 121 mg/dL — ABNORMAL HIGH (ref 70–99)
Potassium: 4.4 mmol/L (ref 3.5–5.1)
Sodium: 141 mmol/L (ref 135–145)
Total Bilirubin: 1 mg/dL (ref 0.3–1.2)
Total Protein: 6.8 g/dL (ref 6.5–8.1)

## 2018-12-29 LAB — URINALYSIS, ROUTINE W REFLEX MICROSCOPIC
Bilirubin Urine: NEGATIVE
Glucose, UA: NEGATIVE mg/dL
Hgb urine dipstick: NEGATIVE
Ketones, ur: NEGATIVE mg/dL
Leukocytes,Ua: NEGATIVE
Nitrite: NEGATIVE
Protein, ur: NEGATIVE mg/dL
Specific Gravity, Urine: 1.009 (ref 1.005–1.030)
pH: 7 (ref 5.0–8.0)

## 2018-12-29 MED ORDER — ONDANSETRON HCL 4 MG/2ML IJ SOLN
4.0000 mg | Freq: Once | INTRAMUSCULAR | Status: AC
  Administered 2018-12-29: 4 mg via INTRAVENOUS
  Filled 2018-12-29: qty 2

## 2018-12-29 MED ORDER — IRBESARTAN 150 MG PO TABS
300.0000 mg | ORAL_TABLET | Freq: Every day | ORAL | Status: DC
  Filled 2018-12-29 (×2): qty 1

## 2018-12-29 MED ORDER — PANTOPRAZOLE SODIUM 20 MG PO TBEC: 20.0000 mg | DELAYED_RELEASE_TABLET | Freq: Every day | ORAL | 0 refills | Status: DC

## 2018-12-29 MED ORDER — IOHEXOL 300 MG/ML  SOLN
100.0000 mL | Freq: Once | INTRAMUSCULAR | Status: AC | PRN
  Administered 2018-12-29: 100 mL via INTRAVENOUS

## 2018-12-29 NOTE — ED Provider Notes (Signed)
Patient states he wants to be discharged home.  He states he is not suicidal not homicidal.  He will follow-up with his primary care doctor next week for his abdominal discomfort and mild depression   Milton Ferguson, MD 12/29/18 [phone]

## 2018-12-29 NOTE — ED Triage Notes (Signed)
Pt c/o abd pain that started in testicles. Pt states he had loose stool 2 days ago and hard stool yesterday.

## 2018-12-29 NOTE — ED Provider Notes (Signed)
[facility] EMERGENCY DEPARTMENT Provider Note   CSN: [id number] Arrival date & time: 12/29/18  0055  Time seen 4:40 AM  History   Chief Complaint Chief Complaint  Patient presents with   Abdominal Pain    HPI Daniel Frank is a 68 y.o. male.     HPI patient states 4 weeks ago he started having a fleeting pain in his left testicle and the cord that comes up from it.  He then states he would have a pain and points a finger in his left lower and right lower quadrant that would come and go and last a second.  He states it would happen "occasionally".  He states 4 weeks ago he started getting pain in the right lower quadrant every day for several times a day and it would last a few seconds.  He states tonight about 8 PM he has had dull abdominal pain that is currently in the middle of his abdomen however it has been moving around.  This pain has been constant.  He has some pressure in his pelvic area.  He has nausea without vomiting or diarrhea.  He states he could be constipated.  He states he has a heaviness in his pelvis.  He denies dysuria but states sometimes he has frequency and sometimes he does not urinate much at all.  He denies any fever.  He states nothing he does makes the pain worse, nothing he does makes it feel better.  He denies any prior surgeries on his abdomen.  He denies any pain in his testicle now.  He states he used to see [facility] for hematuria of unknown cause.  He states he was evaluated 3 years ago for abdominal pain and was seen by [facility].  He had a colonoscopy with 2 benign polyps.  He states he thinks that pain was from stress.  At that time his 30 year old mother was not doing well.  He states he is now retired and he lives alone.  He states he got afraid tonight.  Patient also tells me he is lonely, he states he did not have any friends.  PCP Vernie Shanks, MD   Past Medical History:  Diagnosis Date   Arthritis    Degenerative disc disease      Depressive disorder, not elsewhere classified    Hyperactivity of bladder    Organic impotence    Other and unspecified hyperlipidemia    Sleep apnea    cpap   Spastic dysuria    Unspecified essential hypertension     Patient Active Problem List   Diagnosis Date Noted   Need for prophylactic vaccination and inoculation against influenza 04/07/2013   HLD (hyperlipidemia) 01/03/2013   Paresthesias 01/03/2013   Unspecified essential hypertension 09/02/2012   Other and unspecified hyperlipidemia 09/02/2012   Degenerative disc disease, lumbar 09/02/2012   Overactive bladder 09/02/2012   Obesity, unspecified 09/02/2012   Hyperglycemia 09/02/2012   Neck pain on left side 09/02/2012   Nocturia 12/21/2010   Mixed sleep apnea 10/02/2010   PERSONAL HX COLONIC POLYPS 06/14/2008    History reviewed. No pertinent surgical history.      Home Medications    Prior to Admission medications   Medication Sig Start Date End Date Taking? Authorizing Provider  atorvastatin (LIPITOR) 40 MG tablet Take 1 tablet (40 mg total) by mouth daily. 10/09/13   Vernie Shanks, MD  Multiple Vitamin (MULTIVITAMIN) tablet Take 1 tablet by mouth daily.    [provider]  olmesartan (BENICAR) 40 MG tablet Take 1 tablet by mouth  every day 07/14/13   Vernie Shanks, MD    Family History Family History  Problem Relation Age of Onset   Heart disease Father    Uterine cancer Sister    Cancer Sister        uterine   Colon cancer Neg Hx    Stomach cancer Neg Hx     Social History Social History   Tobacco Use   Smoking status: Former Smoker    Types: Cigarettes    Quit date: 07/27/2006    Years since quitting: 12.4   Smokeless tobacco: Never Used  Substance Use Topics   Alcohol use: Not Currently    Alcohol/week: 5.0 standard drinks    Types: 5 Cans of beer per week   Drug use: No  retired Lives alone   Allergies   Patient has no known  allergies.   Review of Systems Review of Systems  All other systems reviewed and are negative.    Physical Exam Updated Vital Signs BP 118/70    Pulse (!) 56    Temp 97.9 F (36.6 C)    Resp 16    SpO2 95%   Physical Exam Vitals signs and nursing note reviewed.  Constitutional:      General: He is not in acute distress.    Appearance: Normal appearance. He is well-developed. He is not ill-appearing or toxic-appearing.  HENT:     Head: Normocephalic and atraumatic.     Right Ear: External ear normal.     Left Ear: External ear normal.     Nose: Nose normal. No mucosal edema or rhinorrhea.     Mouth/Throat:     Mouth: Mucous membranes are dry.     Dentition: No dental abscesses.     Pharynx: No uvula swelling.  Eyes:     Extraocular Movements: Extraocular movements intact.     Conjunctiva/sclera: Conjunctivae normal.     Pupils: Pupils are equal, round, and reactive to light.  Neck:     Musculoskeletal: Full passive range of motion without pain, normal range of motion and neck supple.  Cardiovascular:     Rate and Rhythm: Normal rate and regular rhythm.     Heart sounds: Normal heart sounds. No murmur. No friction rub. No gallop.   Pulmonary:     Effort: Pulmonary effort is normal. No respiratory distress.     Breath sounds: Normal breath sounds. No wheezing, rhonchi or rales.  Chest:     Chest wall: No tenderness or crepitus.  Abdominal:     General: Bowel sounds are normal. There is no distension.     Palpations: Abdomen is soft.     Tenderness: There is no abdominal tenderness. There is no guarding or rebound.  Genitourinary:    Comments: Mild tenderness over the left epididymis Musculoskeletal: Normal range of motion.        General: No tenderness.     Comments: Moves all extremities well.   Skin:    General: Skin is warm and dry.     Coloration: Skin is not pale.     Findings: No erythema or rash.  Neurological:     General: No focal deficit present.      Mental Status: He is alert and oriented to person, place, and time.     Cranial Nerves: No cranial nerve deficit.  Psychiatric:        Attention and Perception: Attention  and perception normal.        Mood and Affect: Mood is not anxious. Affect is flat.        Speech: Speech is delayed.        Behavior: Behavior is slowed.      ED Treatments / Results  Labs (all labs ordered are listed, but only abnormal results are displayed)  Results for orders placed or performed during the hospital encounter of 12/29/18  Comprehensive metabolic panel  Result Value Ref Range   Sodium 141 135 - 145 mmol/L   Potassium 4.4 3.5 - 5.1 mmol/L   Chloride 106 98 - 111 mmol/L   CO2 28 22 - 32 mmol/L   Glucose, Bld 121 (H) 70 - 99 mg/dL   BUN 13 8 - 23 mg/dL   Creatinine, Ser 1.00 0.61 - 1.24 mg/dL   Calcium 9.1 8.9 - 10.3 mg/dL   Total Protein 6.8 6.5 - 8.1 g/dL   Albumin 4.1 3.5 - 5.0 g/dL   AST 21 15 - 41 U/L   ALT 24 0 - 44 U/L   Alkaline Phosphatase 63 38 - 126 U/L   Total Bilirubin 1.0 0.3 - 1.2 mg/dL   GFR calc non Af Amer >60 >60 mL/min   GFR calc Af Amer >60 >60 mL/min   Anion gap 7 5 - 15  Lipase, blood  Result Value Ref Range   Lipase 82 (H) 11 - 51 U/L  CBC with Differential  Result Value Ref Range   WBC 5.6 4.0 - 10.5 K/uL   RBC 4.52 4.22 - 5.81 MIL/uL   Hemoglobin 14.8 13.0 - 17.0 g/dL   HCT 43.7 39.0 - 52.0 %   MCV 96.7 80.0 - 100.0 fL   MCH 32.7 26.0 - 34.0 pg   MCHC 33.9 30.0 - 36.0 g/dL   RDW 12.0 11.5 - 15.5 %   Platelets 156 150 - 400 K/uL   nRBC 0.0 0.0 - 0.2 %   Neutrophils Relative % 63 %   Neutro Abs 3.5 1.7 - 7.7 K/uL   Lymphocytes Relative 26 %   Lymphs Abs 1.4 0.7 - 4.0 K/uL   Monocytes Relative 8 %   Monocytes Absolute 0.4 0.1 - 1.0 K/uL   Eosinophils Relative 2 %   Eosinophils Absolute 0.1 0.0 - 0.5 K/uL   Basophils Relative 1 %   Basophils Absolute 0.0 0.0 - 0.1 K/uL   Immature Granulocytes 0 %   Abs Immature Granulocytes 0.01 0.00 - 0.07 K/uL   Urinalysis, Routine w reflex microscopic  Result Value Ref Range   Color, Urine YELLOW YELLOW   APPearance CLEAR CLEAR   Specific Gravity, Urine 1.009 1.005 - 1.030   pH 7.0 5.0 - 8.0   Glucose, UA NEGATIVE NEGATIVE mg/dL   Hgb urine dipstick NEGATIVE NEGATIVE   Bilirubin Urine NEGATIVE NEGATIVE   Ketones, ur NEGATIVE NEGATIVE mg/dL   Protein, ur NEGATIVE NEGATIVE mg/dL   Nitrite NEGATIVE NEGATIVE   Leukocytes,Ua NEGATIVE NEGATIVE   Laboratory interpretation all normal except not significant elevation of lipase   EKG None  Radiology Ct Abdomen Pelvis W Contrast  Result Date: 12/29/2018 CLINICAL DATA:  Migrating abdominal pain since 8 p.m. EXAM: CT ABDOMEN AND PELVIS WITH CONTRAST TECHNIQUE: Multidetector CT imaging of the abdomen and pelvis was performed using the standard protocol following bolus administration of intravenous contrast. CONTRAST:  123mL OMNIPAQUE IOHEXOL 300 MG/ML  SOLN COMPARISON:  10/17/2017 FINDINGS: Lower chest:  No contributory findings. Hepatobiliary: No focal liver abnormality.No  evidence of biliary obstruction or stone. Pancreas: Unremarkable. Spleen: Unremarkable. Adrenals/Urinary Tract: Negative adrenals. No hydronephrosis or stone. Tiny right renal cystic density. Unremarkable bladder. Stomach/Bowel:  No obstruction. No appendicitis. Vascular/Lymphatic: No acute vascular abnormality. No mass or adenopathy. Reproductive:Incidental prostate calcification. Other: No ascites or pneumoperitoneum. Musculoskeletal: No acute abnormalities. Diffuse degenerative disc narrowing and ridging in the lumbar spine. Chronic L5 pars defects with anterolisthesis and biforaminal stenosis. IMPRESSION: No emergent finding or explanation for abdominal pain. Electronically Signed   By: Monte Fantasia M.D.   On: 12/29/2018 07:05    Procedures Procedures (including critical care time)  Medications Ordered in ED Medications  ondansetron (ZOFRAN) injection 4 mg (4 mg Intravenous  Given 12/29/18 0522)  iohexol (OMNIPAQUE) 300 MG/ML solution 100 mL (100 mLs Intravenous Contrast Given 12/29/18 0602)     Initial Impression / Assessment and Plan / ED Course  I have reviewed the triage vital signs and the nursing notes.  Pertinent labs & imaging results that were available during my care of the patient were reviewed by me and considered in my medical decision making (see chart for details).       Patient presents with some vague abdominal complaints.  Laboratory testing was done, bladder scan was done to make sure he is not having overflow incontinence symptoms, CT abdomen was done.  At change of shift CT scan is still pending.  If it shows anything it may show constipation.  Patient probably needs to be asked if he wants to speak to mental health worker because he complains of loneliness.  Patient was rechecked at 7:10 AM with nurse in the room.  I gave him the results of his CT scan and his lab work.  I did examine his testicles which were nontender on either side.  I suspect he has some varicoceles because his testicles are a little bit larger than normal.  There is no hard masses felt.  I then approached the idea of talking to a mental health worker.  Patient does admit to being depressed.  He states he had some suicidal thoughts a few days ago.  He at first would not state he would not hurt himself if he left.  He then said "Benicar".  When I told him we were talking about his mental health he states that he needs to have it for his blood pressure this morning.  His Benicar was written and TTS consult was ordered.  He then demanded something to eat and drink.  Final Clinical Impressions(s) / ED Diagnoses   Final diagnoses:  Abdominal pain, unspecified abdominal location  Loneliness  Depression, unspecified depression type  Suicidal thoughts    Disposition pending  Rolland Porter, MD, Barbette Or, MD 12/29/18 [phone]

## 2018-12-29 NOTE — Discharge Instructions (Signed)
Patient will follow-up with his family doctor for the abdominal discomfort

## 2018-12-29 NOTE — ED Notes (Signed)
Bladder scan before urinating was >200cc Pt urinated 200 cc Post residual void 117

## 2019-01-02 ENCOUNTER — Telehealth: Payer: Self-pay | Admitting: Physician Assistant

## 2019-01-02 NOTE — Telephone Encounter (Signed)
If we have any openings put him in with an APP.

## 2019-01-24 ENCOUNTER — Ambulatory Visit: Payer: Medicare Other | Admitting: Physician Assistant

## 2020-03-10 IMAGING — CT CT ABDOMEN AND PELVIS WITH CONTRAST
2 of 4 series · 17 of 46 positions shown, 19 images · IV contrast (Isovue)
Comparison: 10/17/2017

CLINICAL DATA: Migrating abdominal pain since 8 p.m.

EXAM:
CT ABDOMEN AND PELVIS WITH CONTRAST
TECHNIQUE: Multidetector CT imaging of the abdomen and pelvis was performed
using the standard protocol following bolus administration of
intravenous contrast.
CONTRAST:  100mL OMNIPAQUE IOHEXOL 300 MG/ML  SOLN

[Series 2: axial st · axial · 0.92mm/px · z∈[+883,+1338]mm · 14 of 101 slices shown, 16 images]
[im 5/101  soft-tissue]
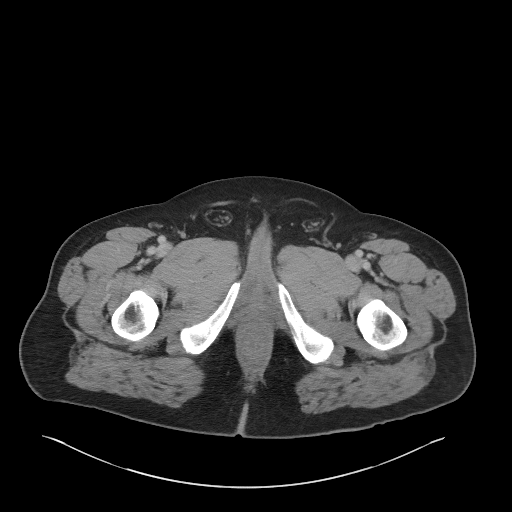
[im 5/101  bone]
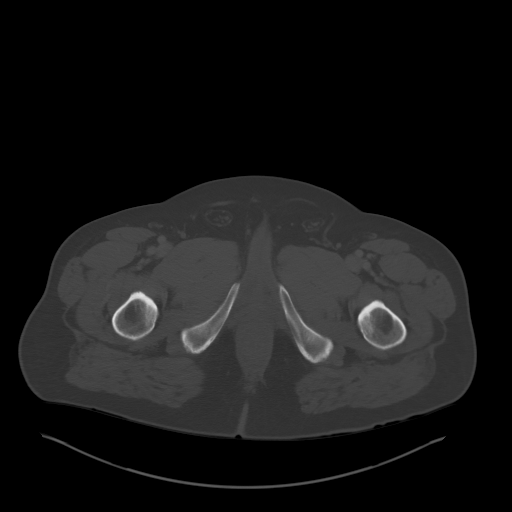
[im 15/101  soft-tissue]
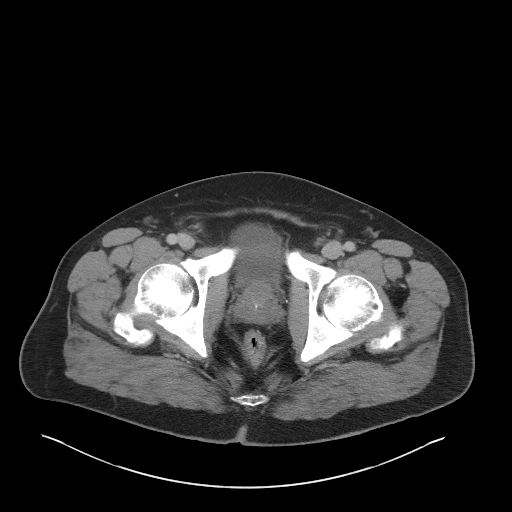
[im 20/101  soft-tissue]
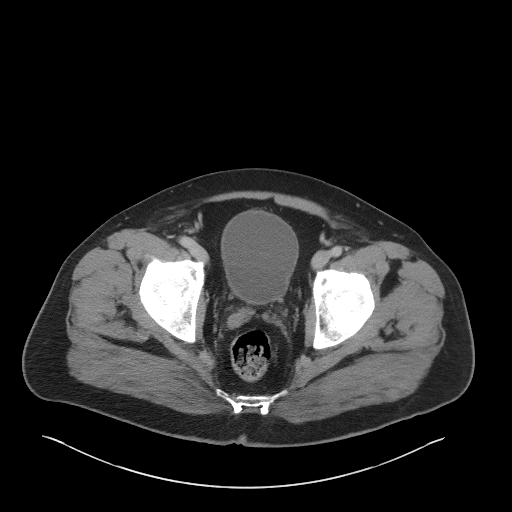
[im 29/101  soft-tissue]
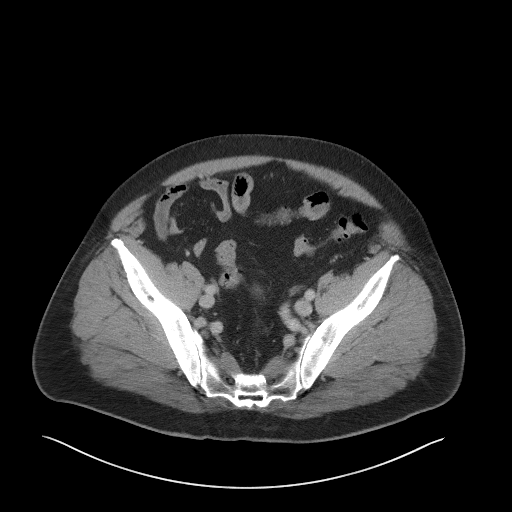
[im 34/101  soft-tissue]
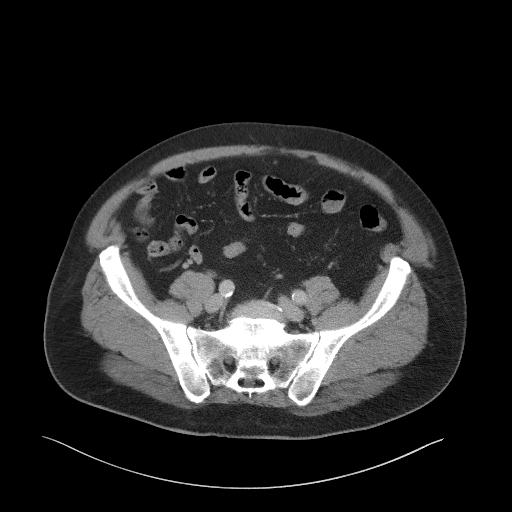
[im 39/101  soft-tissue]
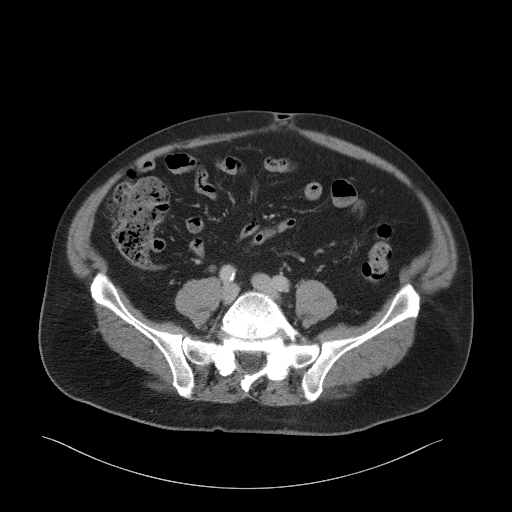
[im 48/101  soft-tissue]
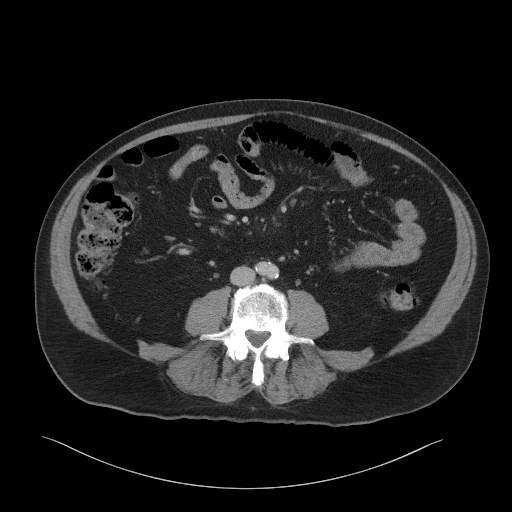
[im 53/101  soft-tissue]
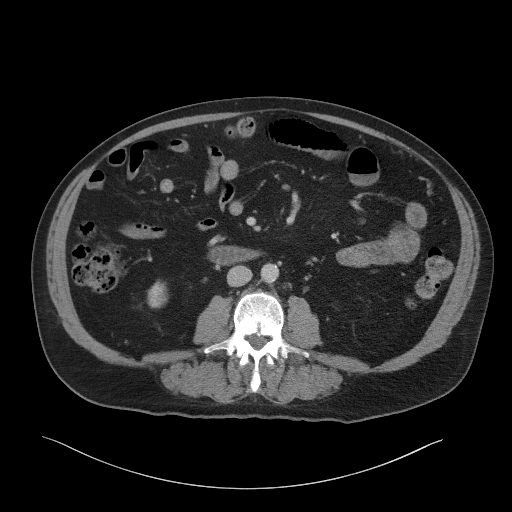
[im 62/101  soft-tissue]
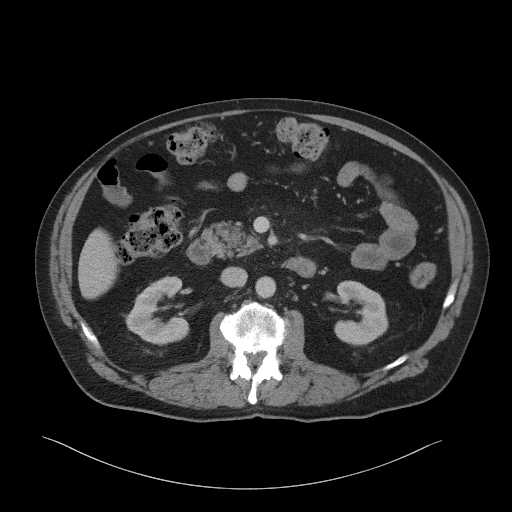
[im 62/101  bone]
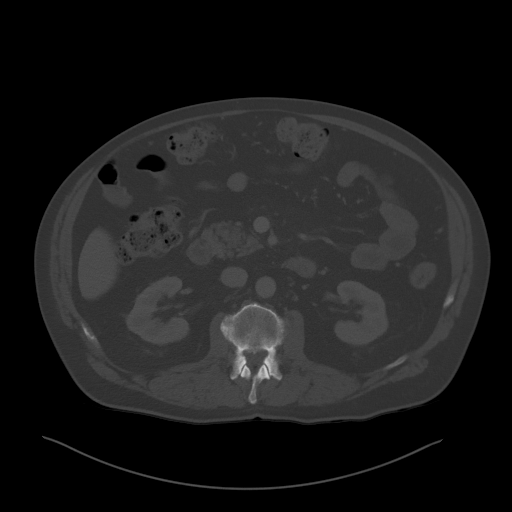
[im 67/101  soft-tissue]
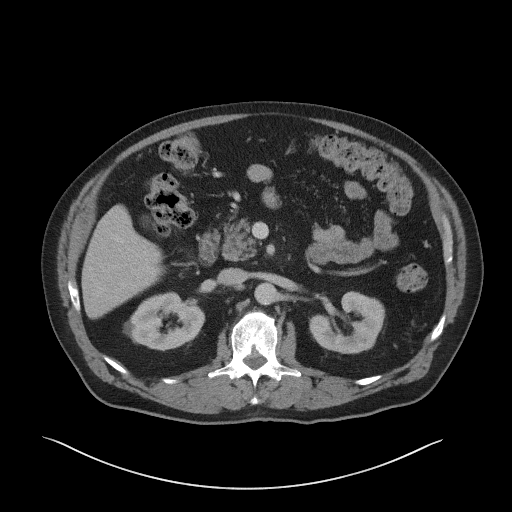
[im 77/101  soft-tissue]
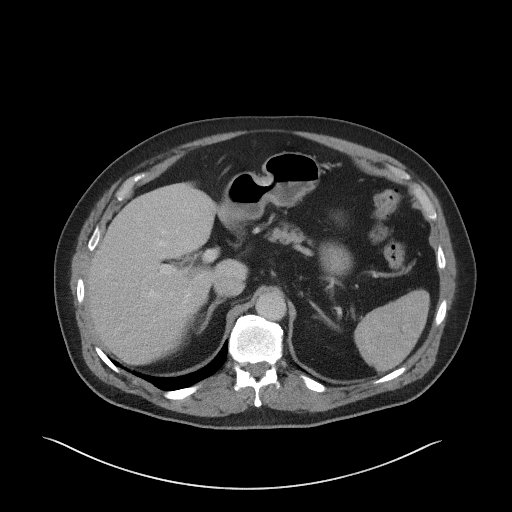
[im 81/101  soft-tissue]
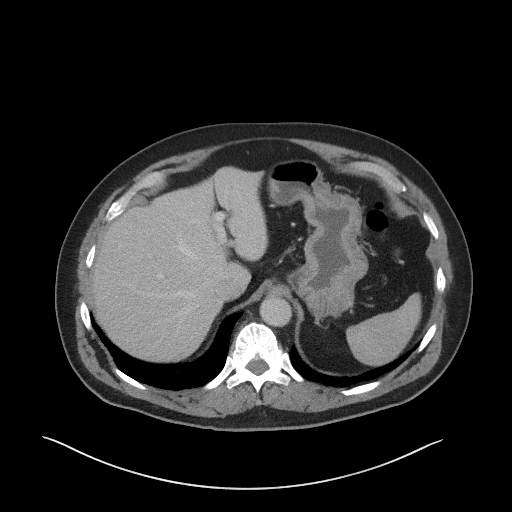
[im 86/101  soft-tissue]
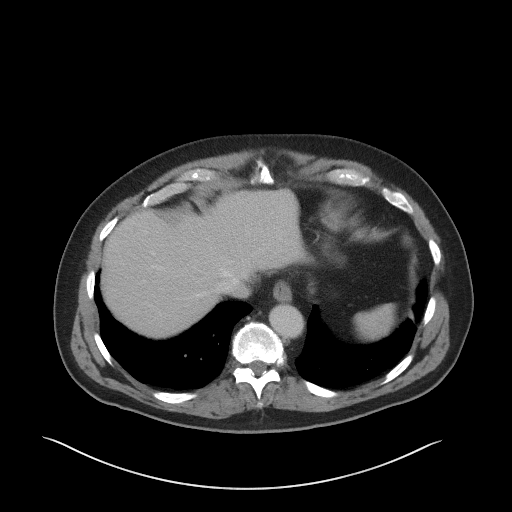
[im 96/101  soft-tissue]
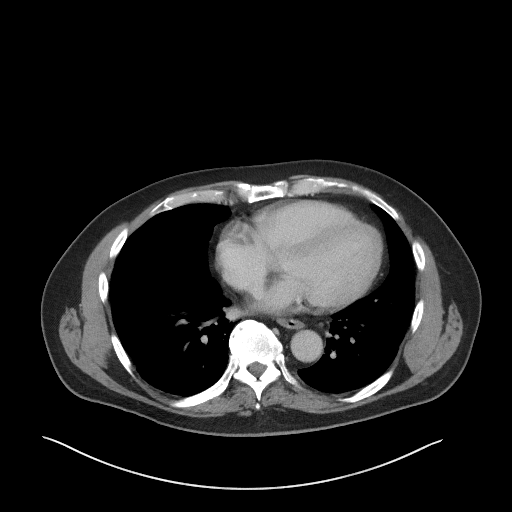

[Series 5: coronal st · coronal · 0.87mm/px · 3 of 117 slices shown]
[im 39/117  soft-tissue]
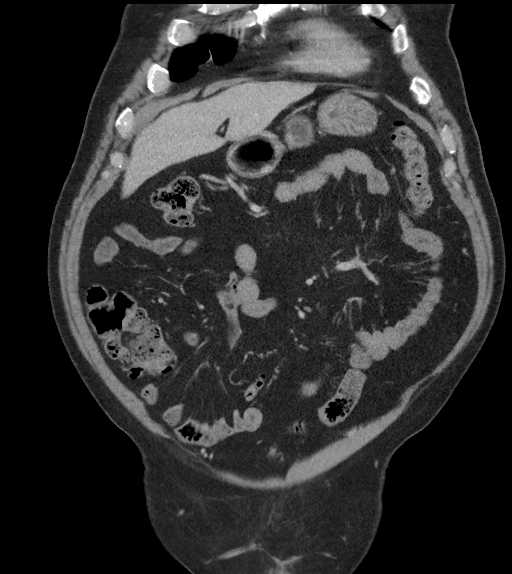
[im 52/117  soft-tissue]
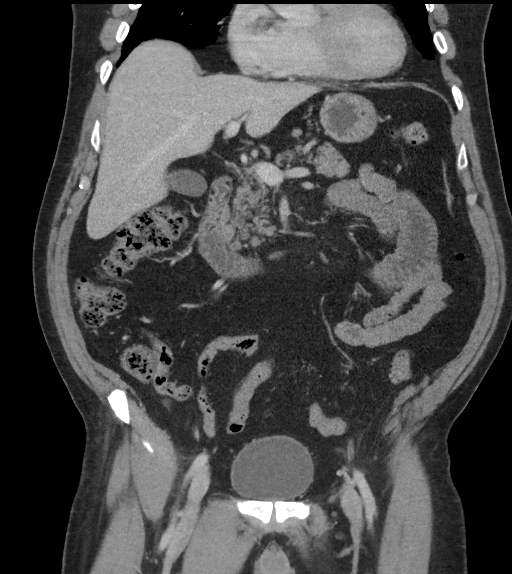
[im 65/117  soft-tissue]
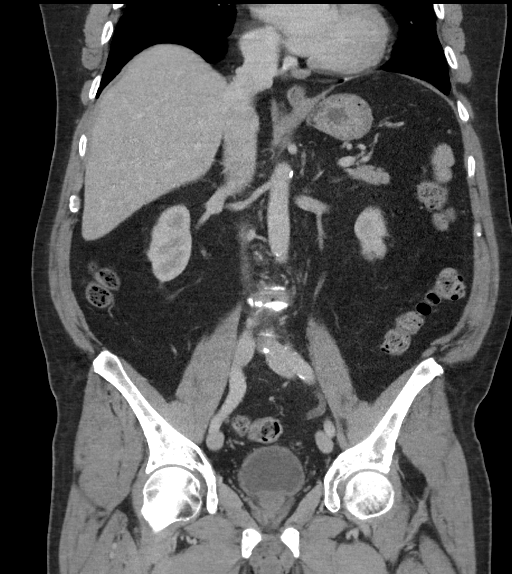

[17 of 46 positions shown; findings below may reference images not displayed]

FINDINGS: Lower chest:  No contributory findings.

Hepatobiliary: No focal liver abnormality.No evidence of biliary
obstruction or stone.

Pancreas: Unremarkable.

Spleen: Unremarkable.

Adrenals/Urinary Tract: Negative adrenals. No hydronephrosis or
stone. Tiny right renal cystic density. Unremarkable bladder.

Stomach/Bowel:  No obstruction. No appendicitis.

Vascular/Lymphatic: No acute vascular abnormality. No mass or
adenopathy.

Reproductive:Incidental prostate calcification.

Other: No ascites or pneumoperitoneum.

Musculoskeletal: No acute abnormalities. Diffuse degenerative disc
narrowing and ridging in the lumbar spine. Chronic L5 pars defects
with anterolisthesis and biforaminal stenosis.
IMPRESSION: No emergent finding or explanation for abdominal pain.

## 2020-03-20 ENCOUNTER — Ambulatory Visit: Payer: Medicare Other | Admitting: Gastroenterology

## 2020-03-20 ENCOUNTER — Encounter: Payer: Self-pay | Admitting: Gastroenterology

## 2020-03-20 VITALS — BP 138/80 | HR 69 | Ht 71.0 in | Wt 216.4 lb

## 2020-03-20 DIAGNOSIS — R1033 Periumbilical pain: Secondary | ICD-10-CM

## 2020-03-20 DIAGNOSIS — R1013 Epigastric pain: Secondary | ICD-10-CM

## 2020-03-20 MED ORDER — DICYCLOMINE HCL 10 MG PO CAPS: 10.0000 mg | ORAL_CAPSULE | Freq: Three times a day (TID) | ORAL | 11 refills | Status: DC | PRN

## 2020-03-20 NOTE — Patient Instructions (Signed)
We have sent the following medications to your pharmacy for you to pick up at your convenience: dicyclomine.   Take your pantoprazole daily.  Call our office back if you decide to proceed with Upper Endoscopy.   Thank you for choosing me and [facility].  Pricilla Riffle. Dagoberto Ligas., MD., Marval Regal

## 2020-03-20 NOTE — Progress Notes (Signed)
° ° °  History of Present Illness: This is a 69 year old male returning for abdominal pain.  He was evaluated in March 2019 with complaints of periumbilical abdominal pain and a change in bowel habits. CT AP in March 2019 was unremarkable.  Colonoscopy in April 2019 showed one small adenomatous colon polyp, left colon diverticulosis and internal hemorrhoids.  He was evaluated in the ED in July 2020 for abdominal pain. CT AP in July 2020 was unremarkable. CBC and CMP were unremarkable.  His lipase was minimally elevated at 82.  He relates periumbilical and epigastric pain that has been severe on infrequent occasions and mild at other times.  He has had long periods of time without symptoms.  Over the course of the past several months he has determined that drinking beer regularly causes his symptoms and hard liquor also causes symptoms.  When he avoids alcohol he is asymptomatic.  Current Medications, Allergies, Past Medical History, Past Surgical History, Family History and Social History were reviewed in [employer] record.   Physical Exam: General: Well developed, well nourished, no acute distress Head: Normocephalic and atraumatic Eyes:  sclerae anicteric, EOMI Ears: Normal auditory acuity Mouth: Not examined, mask on during Covid-19 pandemic Lungs: Clear throughout to auscultation Heart: Regular rate and rhythm; no murmurs, rubs or bruits Abdomen: Soft, non tender and non distended. No masses, hepatosplenomegaly or hernias noted. Normal Bowel sounds Rectal: Not done Musculoskeletal: Symmetrical with no gross deformities  Pulses:  Normal pulses noted Extremities: No clubbing, cyanosis, edema or deformities noted Neurological: Alert oriented x 4, grossly nonfocal Psychological:  Alert and cooperative. Normal mood and affect   Assessment and Recommendations:  1.  Epigastric and periumbilical abdominal pain brought on by alcoholic beverages.  Patient is recommended to  avoid or at least minimize alcohol use.  Mild lipase elevation was noted on his July 2020 ED evaluation however CT of the pancreas appeared normal.  We discussed proceeding with EGD for further evaluation however since he feels his symptoms are clearly related to alcoholic beverages he declines to proceed at this time.  If symptoms flare begin pantoprazole 20 mg daily and dicyclomine 10 mg 3 times daily as needed. GI follow-up as needed.  2.  Personal history of adenomatous colon polyps.  Surveillance colonoscopy is recommended in April 2024.

## 2020-09-04 ENCOUNTER — Emergency Department (HOSPITAL_COMMUNITY): Payer: Medicare Other

## 2020-09-04 ENCOUNTER — Encounter (HOSPITAL_COMMUNITY): Payer: Self-pay

## 2020-09-04 ENCOUNTER — Other Ambulatory Visit: Payer: Self-pay

## 2020-09-04 ENCOUNTER — Emergency Department (HOSPITAL_COMMUNITY)
Admission: EM | Admit: 2020-09-04 | Discharge: 2020-09-04 | Disposition: A | Discharge status: Home / Self Care | Payer: Medicare Other | Attending: Emergency Medicine | Admitting: Emergency Medicine

## 2020-09-04 DIAGNOSIS — R079 Chest pain, unspecified: Secondary | ICD-10-CM | POA: Diagnosis present

## 2020-09-04 DIAGNOSIS — R0789 Other chest pain: Secondary | ICD-10-CM | POA: Diagnosis not present

## 2020-09-04 DIAGNOSIS — I10 Essential (primary) hypertension: Secondary | ICD-10-CM | POA: Diagnosis not present

## 2020-09-04 DIAGNOSIS — Z87891 Personal history of nicotine dependence: Secondary | ICD-10-CM | POA: Diagnosis not present

## 2020-09-04 DIAGNOSIS — Z79899 Other long term (current) drug therapy: Secondary | ICD-10-CM | POA: Diagnosis not present

## 2020-09-04 LAB — BASIC METABOLIC PANEL
Anion gap: 8 (ref 5–15)
BUN: 17 mg/dL (ref 8–23)
CO2: 27 mmol/L (ref 22–32)
Calcium: 9.2 mg/dL (ref 8.9–10.3)
Chloride: 104 mmol/L (ref 98–111)
Creatinine, Ser: 0.97 mg/dL (ref 0.61–1.24)
GFR, Estimated: >60 mL/min (ref >60)
Glucose, Bld: 103 mg/dL — ABNORMAL HIGH (ref 70–99)
Potassium: 3.8 mmol/L (ref 3.5–5.1)
Sodium: 139 mmol/L (ref 135–145)

## 2020-09-04 LAB — CBC
HCT: 44.1 % (ref 39.0–52.0)
Hemoglobin: 14.8 g/dL (ref 13.0–17.0)
MCH: 32.7 pg (ref 26.0–34.0)
MCHC: 33.6 g/dL (ref 30.0–36.0)
MCV: 97.6 fL (ref 80.0–100.0)
Platelets: 172 10*3/uL (ref 150–400)
RBC: 4.52 MIL/uL (ref 4.22–5.81)
RDW: 12.4 % (ref 11.5–15.5)
WBC: 8.1 10*3/uL (ref 4.0–10.5)
nRBC: 0 % (ref 0.0–0.2)

## 2020-09-04 LAB — TROPONIN I (HIGH SENSITIVITY)
Troponin I (High Sensitivity): 5 ng/L (ref <18)
Troponin I (High Sensitivity): 7 ng/L (ref <18)

## 2020-09-04 NOTE — Discharge Instructions (Addendum)
Your blood pressure and blood work today were reassuring.  Continue taking your blood pressure medication as directed.  You may record your blood pressure once daily and take your readings with you when you follow-up with your primary doctor.  Return to the emergency department if you develop any new or worsening symptoms.

## 2020-09-04 NOTE — ED Triage Notes (Signed)
Pt brought to ED via [facility]. Pt has been checking BP at home had systolic BP 983 after mowing the yard, systolic lowest with EMS 382'N. Pt denies headache, blurry vision.

## 2020-09-04 NOTE — ED Provider Notes (Signed)
  Face-to-face evaluation   History: The patient presents from home after finding his blood pressure elevated at 220, after he mowed the lawn today.  He checked his blood pressure shortly after going and it was mildly elevated around 160/90.  Gradually increased as he was resting and becoming more anxious at home.  He has not developed any chest pain, headache, focal weakness or paresthesia.  He reports he is under a lot of stress because of family situations.  He denies recent vomiting, vertigo, neck or back pain.  Physical exam: Alert elderly male who is comfortable.  No dysarthria or aphasia.  Heart regular rate and rhythm without murmur lungs clear anteriorly.  Abdomen soft and nontender.  Medical screening examination/treatment/procedure(s) were conducted as a shared visit with non-physician practitioner(s) and myself.  I personally evaluated the patient during the encounter   Daleen Bo, MD 09/06/20 [phone]

## 2020-09-04 NOTE — ED Provider Notes (Signed)
[facility] EMERGENCY DEPARTMENT Provider Note   CSN: [id number] Arrival date & time: 09/04/20  1351     History Chief Complaint  Patient presents with  . Hypertension    Daniel Frank is a 70 y.o. male.  HPI      Daniel Frank is a 70 y.o. male with history of HTN who presents to the Emergency Department complaining of elevated blood pressure, waxing and waning for several days.  Today, he states that he was mowing his lawn and came inside check his blood pressure and states it was elevated with systolic in the 675F.  He has developed some aching of his left chest upon arrival to the ER.  Symptoms have been associated with feeling "winded."  Notes that his blood pressure has been in the 130s to 140s intermittently for several days.  Has been taking Benicar 40 mg daily for some time.  No recent medication changes.  He also notes increased stress recently accompanied by feelings of loneliness and upset that his mother is in a nursing home.  No SI or HI.  He denies shortness of breath, diaphoresis, arm neck or jaw pain, headache or dizziness.  No facial or extremity numbness or weakness. No hx of CAD.   Past Medical History:  Diagnosis Date  . Arthritis   . Degenerative disc disease   . Depressive disorder, not elsewhere classified   . Hyperactivity of bladder   . Organic impotence   . Other and unspecified hyperlipidemia   . Sleep apnea    cpap  . Spastic dysuria   . Unspecified essential hypertension     Patient Active Problem List   Diagnosis Date Noted  . Need for prophylactic vaccination and inoculation against influenza 04/07/2013  . HLD (hyperlipidemia) 01/03/2013  . Paresthesias 01/03/2013  . Unspecified essential hypertension 09/02/2012  . Other and unspecified hyperlipidemia 09/02/2012  . Degenerative disc disease, lumbar 09/02/2012  . Overactive bladder 09/02/2012  . Obesity, unspecified 09/02/2012  . Hyperglycemia 09/02/2012  . Neck pain on left side  09/02/2012  . Nocturia 12/21/2010  . Mixed sleep apnea 10/02/2010  . PERSONAL HX COLONIC POLYPS 06/14/2008    Past Surgical History:  Procedure Laterality Date  . COLONOSCOPY  09/20/2017       Family History  Problem Relation Age of Onset  . Heart disease Father   . Uterine cancer Sister   . Cancer Sister        uterine  . Colon cancer Neg Hx   . Stomach cancer Neg Hx   . Esophageal cancer Neg Hx   . Pancreatic cancer Neg Hx   . Liver disease Neg Hx     Social History   Tobacco Use  . Smoking status: Former Smoker    Types: Cigarettes    Quit date: 07/27/2006    Years since quitting: 14.1  . Smokeless tobacco: Never Used  Vaping Use  . Vaping Use: Never used  Substance Use Topics  . Alcohol use: Yes    Alcohol/week: 11.0 standard drinks    Types: 6 Cans of beer, 5 Shots of liquor per week  . Drug use: No    Home Medications Prior to Admission medications   Medication Sig Start Date End Date Taking? Authorizing Provider  atorvastatin (LIPITOR) 40 MG tablet Take 1 tablet (40 mg total) by mouth daily. 10/09/13   Vernie Shanks, MD  dicyclomine (BENTYL) 10 MG capsule Take 1 capsule (10 mg total) by mouth 3 (three) times  daily as needed for spasms. 03/20/20   Ladene Artist, MD  Multiple Vitamin (MULTIVITAMIN) tablet Take 1 tablet by mouth daily.    [provider]  olmesartan (BENICAR) 40 MG tablet Take 1 tablet by mouth  every day 07/14/13   Vernie Shanks, MD  pantoprazole (PROTONIX) 20 MG tablet Take 1 tablet (20 mg total) by mouth daily. Patient not taking: Reported on 03/20/2020 12/29/18   Milton Ferguson, MD  psyllium (REGULOID) 0.52 g capsule Take 0.52 g by mouth at bedtime as needed.    [provider]    Allergies    Patient has no known allergies.  Review of Systems   Review of Systems  Constitutional: Negative for chills, diaphoresis and fever.  HENT: Negative for sore throat and trouble swallowing.   Eyes: Negative for visual  disturbance.  Respiratory: Positive for shortness of breath. Negative for chest tightness.   Cardiovascular: Positive for chest pain. Negative for palpitations and leg swelling.  Gastrointestinal: Negative for abdominal pain, nausea and vomiting.  Genitourinary: Negative for dysuria, flank pain and hematuria.  Musculoskeletal: Negative for arthralgias, back pain, myalgias, neck pain and neck stiffness.  Skin: Negative for rash.  Neurological: Negative for dizziness, syncope, facial asymmetry, weakness, numbness and headaches.  Hematological: Does not bruise/bleed easily.    Physical Exam Updated Vital Signs BP (!) 155/81   Pulse 64   Temp 98.1 F (36.7 C) (Oral)   Resp 11   Ht 5' 10.5" (1.791 m)   Wt 96.2 kg   SpO2 98%   BMI 29.99 kg/m   Physical Exam Vitals and nursing note reviewed.  Constitutional:      Appearance: Normal appearance. He is not ill-appearing or toxic-appearing.  HENT:     Head: Atraumatic.  Eyes:     Extraocular Movements: Extraocular movements intact.     Conjunctiva/sclera: Conjunctivae normal.     Pupils: Pupils are equal, round, and reactive to light.  Cardiovascular:     Rate and Rhythm: Normal rate and regular rhythm.     Pulses: Normal pulses.  Pulmonary:     Effort: Pulmonary effort is normal.     Breath sounds: Normal breath sounds.  Chest:     Chest wall: No tenderness.  Abdominal:     Palpations: Abdomen is soft.     Tenderness: There is no abdominal tenderness.  Musculoskeletal:        General: Normal range of motion.     Cervical back: Normal range of motion. No tenderness.     Right lower leg: No edema.     Left lower leg: No edema.  Skin:    General: Skin is warm.     Capillary Refill: Capillary refill takes less than 2 seconds.     Findings: No rash.  Neurological:     General: No focal deficit present.     Mental Status: He is alert.     Sensory: Sensation is intact. No sensory deficit.     Motor: No weakness.      Coordination: Coordination is intact.     Gait: Gait is intact.     Comments: CN II-XII intact.  Speech clear.  No pronator or LE drift.       ED Results / Procedures / Treatments   Labs (all labs ordered are listed, but only abnormal results are displayed) Labs Reviewed  BASIC METABOLIC PANEL - Abnormal; Notable for the following components:      Result Value   Glucose, Bld  103 (*)    All other components within normal limits  CBC  TROPONIN I (HIGH SENSITIVITY)  TROPONIN I (HIGH SENSITIVITY)    EKG EKG Interpretation  Date/Time:  Wednesday September 04 2020 14:21:58 EDT Ventricular Rate:  72 PR Interval:  187 QRS Duration: 104 QT Interval:  413 QTC Calculation: 452 R Axis:   16 Text Interpretation: Sinus rhythm Probable left atrial enlargement RSR' in V1 or V2, right VCD or RVH Baseline wander in lead(s) I aVL No previous ECGs available Confirmed by Daleen Bo [phone] on 09/04/2020 2:37:48 PM   Radiology DG Chest Port 1 View  Result Date: 09/04/2020 CLINICAL DATA:  Chest pain. EXAM: PORTABLE CHEST 1 VIEW COMPARISON:  None. FINDINGS: The heart size and mediastinal contours are within normal limits. Both lungs are clear. No pneumothorax or pleural effusion is noted. The visualized skeletal structures are unremarkable. IMPRESSION: No active disease. Electronically Signed   By: Marijo Conception M.D.   On: 09/04/2020 15:33    Procedures Procedures   Medications Ordered in ED Medications - No data to display  ED Course  I have reviewed the triage vital signs and the nursing notes.  Pertinent labs & imaging results that were available during my care of the patient were reviewed by me and considered in my medical decision making (see chart for details).    MDM Rules/Calculators/A&P                          Patient here for evaluation of elevated blood pressure.  BP 137/83 upon arrival.  Patient resting comfortably.  Developed left-sided chest pain shortly after ER arrival.   Does have some risk factors.  No history of coronary artery disease.  Will obtain chest x-ray, EKG and labs.  Work up unremarkable.  Vitals reassuring.  Appears appropriate for d/c home.  Agreeable to f/u with PCP.  Return precautions discussed.     Final Clinical Impression(s) / ED Diagnoses Final diagnoses:  Atypical chest pain    Rx / DC Orders ED Discharge Orders    None       Kem Parkinson, PA-C 09/05/20 2327    Daleen Bo, MD 09/06/20 [phone]

## 2021-11-15 IMAGING — DX DG CHEST 1V PORT
1 series · 2 of 2 positions shown · non-contrast
Comparison: None.

CLINICAL DATA: Chest pain.

EXAM:
PORTABLE CHEST 1 VIEW

[Series 1: chest ap · 0.14mm/px · 2 of 2 slices shown]
[im 1/2]
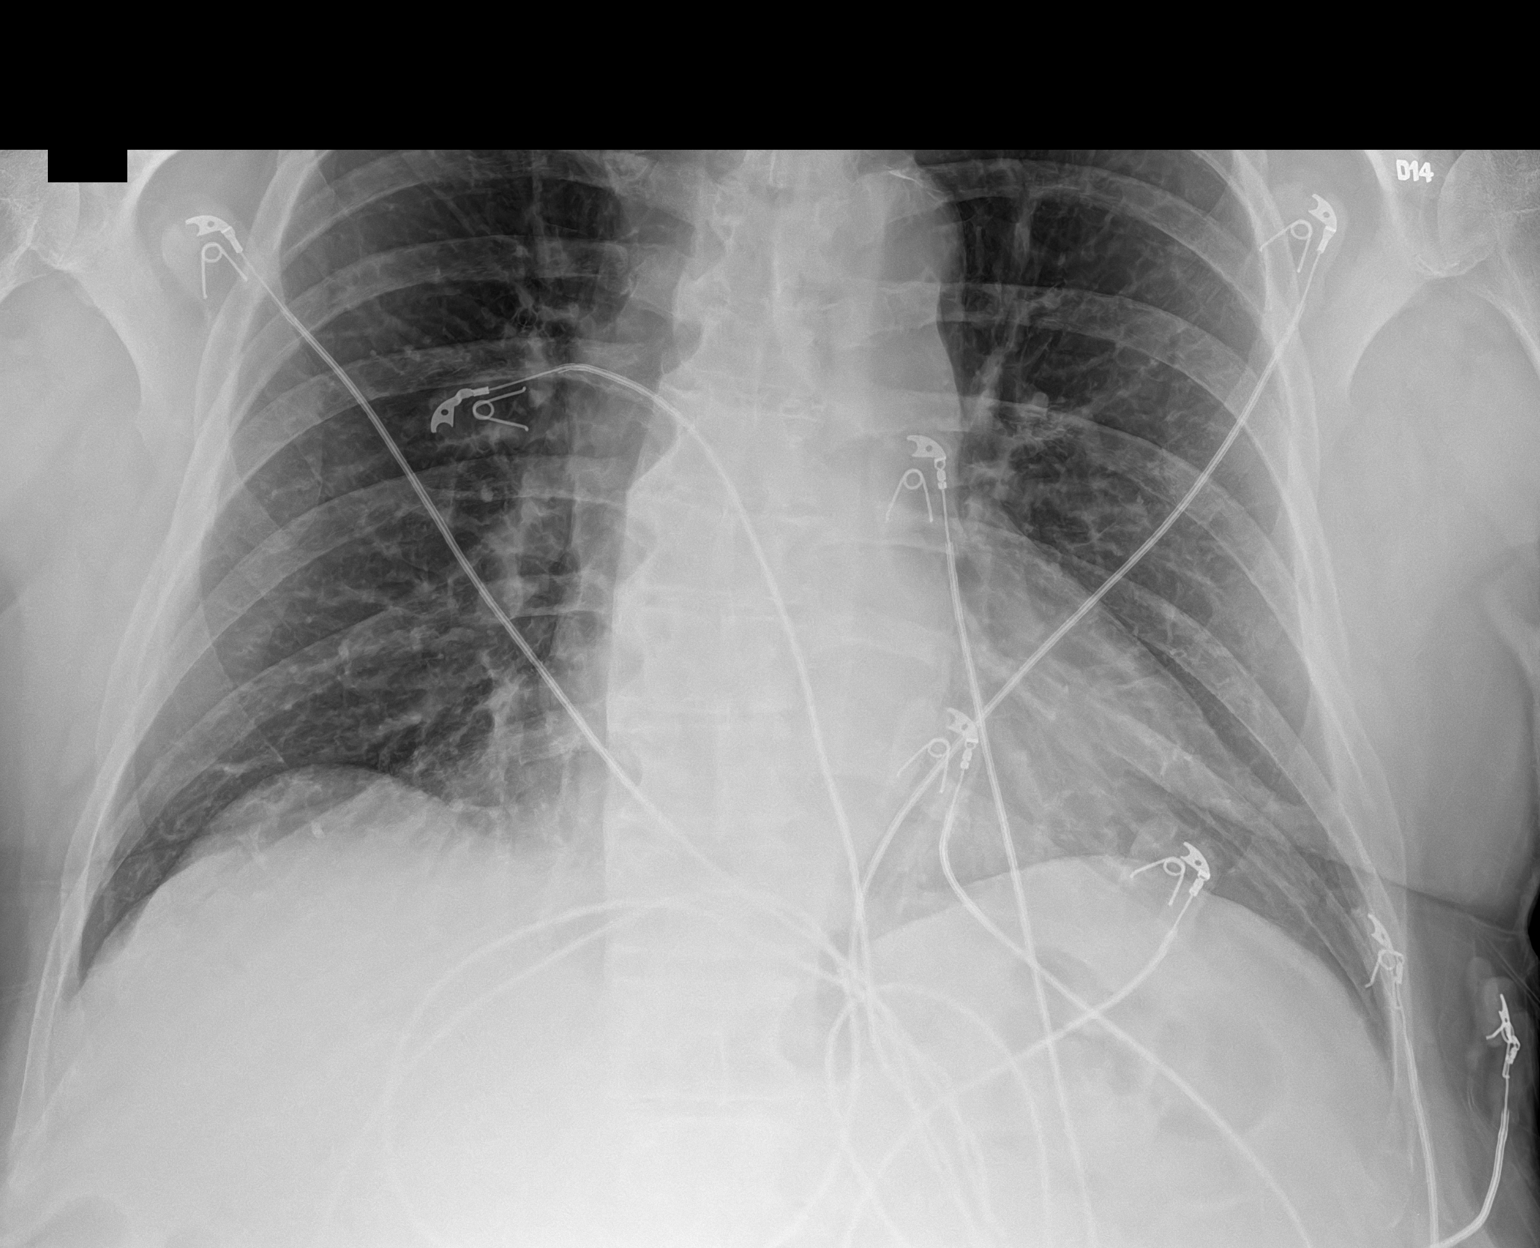
[im 2/2]
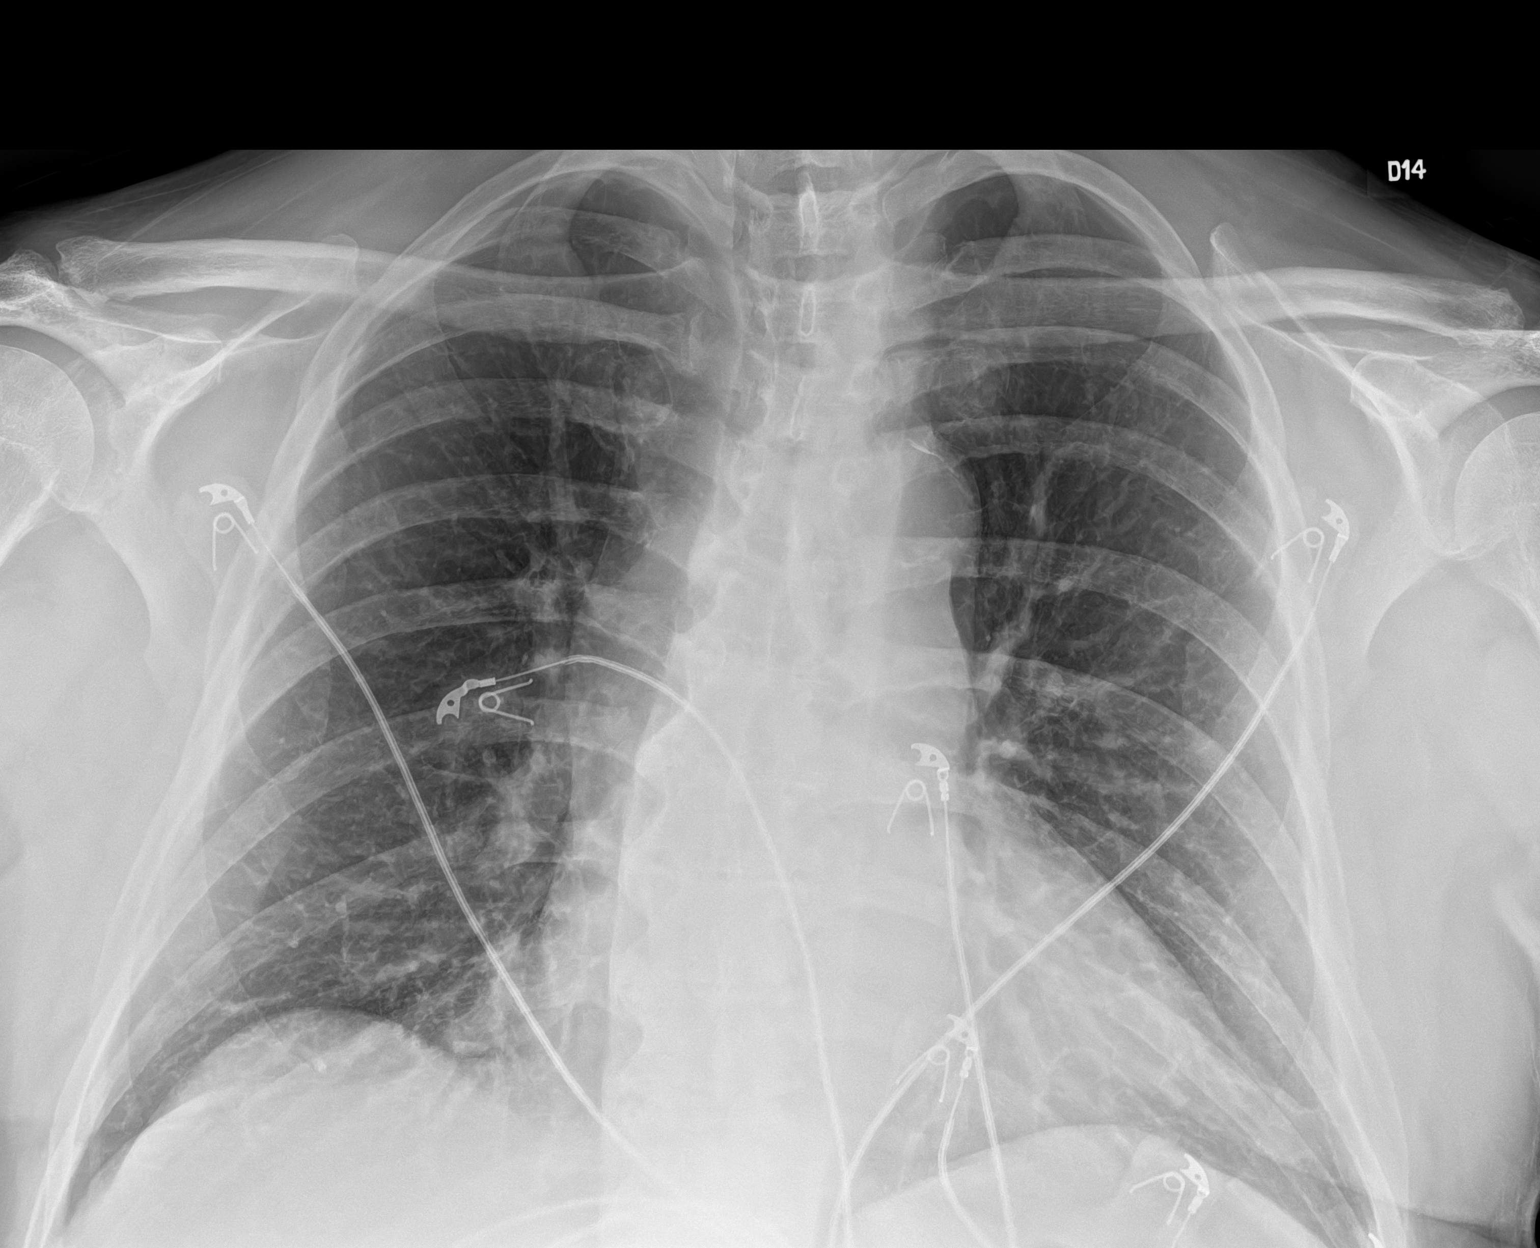

[2 of 2 positions shown; findings below may reference images not displayed]

FINDINGS: The heart size and mediastinal contours are within normal limits.
Both lungs are clear. No pneumothorax or pleural effusion is noted.
The visualized skeletal structures are unremarkable.
IMPRESSION: No active disease.

## 2022-05-04 ENCOUNTER — Ambulatory Visit: Payer: Medicare Other | Admitting: Family Medicine

## 2022-05-04 ENCOUNTER — Encounter: Payer: Self-pay | Admitting: Family Medicine

## 2022-05-04 VITALS — BP 164/81 | HR 104 | Temp 99.0°F | Ht 70.5 in | Wt 226.8 lb

## 2022-05-04 DIAGNOSIS — I10 Essential (primary) hypertension: Secondary | ICD-10-CM | POA: Diagnosis not present

## 2022-05-04 DIAGNOSIS — E782 Mixed hyperlipidemia: Secondary | ICD-10-CM

## 2022-05-04 DIAGNOSIS — E785 Hyperlipidemia, unspecified: Secondary | ICD-10-CM

## 2022-05-04 DIAGNOSIS — G4739 Other sleep apnea: Secondary | ICD-10-CM

## 2022-05-04 DIAGNOSIS — Z87898 Personal history of other specified conditions: Secondary | ICD-10-CM

## 2022-05-04 DIAGNOSIS — R002 Palpitations: Secondary | ICD-10-CM

## 2022-05-04 LAB — BAYER DCA HB A1C WAIVED: HB A1C (BAYER DCA - WAIVED): 5.9 % — ABNORMAL HIGH (ref 4.8–5.6)

## 2022-05-04 MED ORDER — OLMESARTAN MEDOXOMIL 20 MG PO TABS: 20.0000 mg | ORAL_TABLET | Freq: Every day | ORAL | 3 refills | Status: DC

## 2022-05-04 MED ORDER — BETAMETHASONE DIPROPIONATE AUG 0.05 % EX OINT: TOPICAL_OINTMENT | Freq: Every day | CUTANEOUS | 10 refills | Status: AC | PRN

## 2022-05-04 MED ORDER — TAMSULOSIN HCL 0.4 MG PO CAPS: 0.8000 mg | ORAL_CAPSULE | Freq: Every day | ORAL | 3 refills | Status: DC

## 2022-05-04 MED ORDER — ATORVASTATIN CALCIUM 40 MG PO TABS: 40.0000 mg | ORAL_TABLET | Freq: Every day | ORAL | 3 refills | Status: DC

## 2022-05-04 NOTE — Progress Notes (Signed)
Subjective:  Patient ID: Daniel Frank, male    DOB: 1951-04-09  Age: 71 y.o. MRN: [id number]  CC: New Patient (Initial Visit)   HPI [name] presents for new patient evaluation. 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 Bedridden. Good mental status.  Former patient of Dr. Jacelyn Grip.  presents for  follow-up of hypertension. Patient has no history of headache chest pain or shortness of breath or recent cough. Patient also denies symptoms of TIA such as focal numbness or weakness. Patient denies side effects from medication. States taking it regularly.       No data to display          History Daniel Frank has a past medical history of Arthritis, Degenerative disc disease, Depressive disorder, not elsewhere classified, Eczema, Hyperactivity of bladder, Organic impotence, Other and unspecified hyperlipidemia, Rosacea, Sleep apnea, Spastic dysuria, and Unspecified essential hypertension.   He has a past surgical history that includes Colonoscopy (09/20/2017).   His family history includes Cancer in his sister; Chronic bronchitis in his mother; Heart disease in his father; Heart failure in his mother; Lung disease in his father; Obesity in his brother and sister; Uterine cancer in his sister.He reports that he quit smoking about 15 years ago. His smoking use included cigarettes. He has never used smokeless tobacco. He reports that he does not currently use alcohol after a past usage of about 11.0 standard drinks of alcohol per week. He reports that he does not use drugs.    ROS Review of Systems  Constitutional: Negative.   HENT: Negative.    Eyes:  Negative for visual disturbance.  Respiratory:  Negative for cough and shortness of breath.   Cardiovascular:  Negative for chest pain and leg swelling.  Gastrointestinal:  Negative for abdominal pain, diarrhea, nausea and vomiting.  Genitourinary:  Negative for difficulty urinating.  Musculoskeletal:  Negative for arthralgias and myalgias.   Skin:  Negative for rash.  Neurological:  Negative for headaches.  Psychiatric/Behavioral:  Negative for sleep disturbance.     Objective:  There were no vitals taken for this visit.  BP Readings from Last 3 Encounters:  09/04/20 (!) 146/86  03/20/20 138/80  12/29/18 125/82    Wt Readings from Last 3 Encounters:  09/04/20 212 lb (96.2 kg)  03/20/20 216 lb 6.4 oz (98.2 kg)  09/20/17 225 lb (102.1 kg)     Physical Exam Constitutional:      General: He is not in acute distress.    Appearance: He is well-developed.  HENT:     Head: Normocephalic and atraumatic.     Right Ear: External ear normal.     Left Ear: External ear normal.     Nose: Nose normal.  Eyes:     Conjunctiva/sclera: Conjunctivae normal.     Pupils: Pupils are equal, round, and reactive to light.  Cardiovascular:     Rate and Rhythm: Normal rate and regular rhythm.     Heart sounds: Normal heart sounds. No murmur heard. Pulmonary:     Effort: Pulmonary effort is normal. No respiratory distress.     Breath sounds: Normal breath sounds. No wheezing or rales.  Abdominal:     Palpations: Abdomen is soft.     Tenderness: There is no abdominal tenderness.  Musculoskeletal:        General: Normal range of motion.     Cervical back: Normal range of motion and neck supple.  Skin:    General: Skin is warm and dry.  Neurological:  Mental Status: He is alert and oriented to person, place, and time.     Deep Tendon Reflexes: Reflexes are normal and symmetric.  Psychiatric:        Behavior: Behavior normal.        Thought Content: Thought content normal.        Judgment: Judgment normal.       Assessment & Plan:   There are no diagnoses linked to this encounter.     I am having Timmothy Euler maintain his olmesartan, atorvastatin, multivitamin, pantoprazole, psyllium, dicyclomine, olmesartan, and sodium chloride. We will continue to administer sodium chloride.  Allergies as of 05/04/2022   No  Known Allergies      Medication List        Accurate as of May 04, 2022 11:14 AM. If you have any questions, ask your nurse or doctor.          atorvastatin 40 MG tablet Commonly known as: LIPITOR Take 1 tablet (40 mg total) by mouth daily.   dicyclomine 10 MG capsule Commonly known as: BENTYL Take 1 capsule (10 mg total) by mouth 3 (three) times daily as needed for spasms.   multivitamin tablet Take 1 tablet by mouth daily.   olmesartan 40 MG tablet Commonly known as: Benicar Take 1 tablet by mouth  every day   olmesartan 20 MG tablet Commonly known as: BENICAR Take 20 mg by mouth daily.   pantoprazole 20 MG tablet Commonly known as: PROTONIX Take 1 tablet (20 mg total) by mouth daily.   psyllium 0.52 g capsule Commonly known as: REGULOID Take 0.52 g by mouth at bedtime as needed.   sodium chloride 0.65 % Soln nasal spray Commonly known as: OCEAN Place 1 spray into both nostrils as needed for congestion.         Follow-up: No follow-ups on file.  Claretta Fraise, M.D.

## 2022-05-05 LAB — LIPID PANEL
Chol/HDL Ratio: 2.5 ratio (ref 0.0–5.0)
Cholesterol, Total: 130 mg/dL (ref 100–199)
HDL: 51 mg/dL (ref >39)
LDL Chol Calc (NIH): 60 mg/dL (ref 0–99)
Triglycerides: 105 mg/dL (ref 0–149)
VLDL Cholesterol Cal: 19 mg/dL (ref 5–40)

## 2022-05-05 LAB — CMP14+EGFR
ALT: 24 IU/L (ref 0–44)
AST: 21 IU/L (ref 0–40)
Albumin/Globulin Ratio: 2.6 — ABNORMAL HIGH (ref 1.2–2.2)
Albumin: 4.5 g/dL (ref 3.9–4.9)
Alkaline Phosphatase: 85 IU/L (ref 44–121)
BUN/Creatinine Ratio: 12 (ref 10–24)
BUN: 13 mg/dL (ref 8–27)
Bilirubin Total: 0.5 mg/dL (ref 0.0–1.2)
CO2: 25 mmol/L (ref 20–29)
Calcium: 9.3 mg/dL (ref 8.6–10.2)
Chloride: 105 mmol/L (ref 96–106)
Creatinine, Ser: 1.09 mg/dL (ref 0.76–1.27)
Globulin, Total: 1.7 g/dL (ref 1.5–4.5)
Glucose: 121 mg/dL — ABNORMAL HIGH (ref 70–99)
Potassium: 4.2 mmol/L (ref 3.5–5.2)
Sodium: 144 mmol/L (ref 134–144)
Total Protein: 6.2 g/dL (ref 6.0–8.5)
eGFR: 73 mL/min/{1.73_m2} (ref >59)

## 2022-05-05 LAB — CBC WITH DIFFERENTIAL/PLATELET
Basophils Absolute: 0.1 10*3/uL (ref 0.0–0.2)
Basos: 1 %
EOS (ABSOLUTE): 0.3 10*3/uL (ref 0.0–0.4)
Eos: 4 %
Hematocrit: 43.9 % (ref 37.5–51.0)
Hemoglobin: 15 g/dL (ref 13.0–17.7)
Immature Grans (Abs): 0 10*3/uL (ref 0.0–0.1)
Immature Granulocytes: 0 %
Lymphocytes Absolute: 1.2 10*3/uL (ref 0.7–3.1)
Lymphs: 21 %
MCH: 32.5 pg (ref 26.6–33.0)
MCHC: 34.2 g/dL (ref 31.5–35.7)
MCV: 95 fL (ref 79–97)
Monocytes Absolute: 0.5 10*3/uL (ref 0.1–0.9)
Monocytes: 9 %
Neutrophils Absolute: 3.7 10*3/uL (ref 1.4–7.0)
Neutrophils: 65 %
Platelets: 159 10*3/uL (ref 150–450)
RBC: 4.61 x10E6/uL (ref 4.14–5.80)
RDW: 12.1 % (ref 11.6–15.4)
WBC: 5.8 10*3/uL (ref 3.4–10.8)

## 2022-05-05 NOTE — Progress Notes (Signed)
Hello Erskine,  Your lab result is normal and/or stable.Some minor variations that are not significant are commonly marked abnormal, but do not represent any medical problem for you.  Best regards, Haydin Dunn, M.D.

## 2022-05-14 NOTE — Progress Notes (Signed)
Cardiology Office Note   Date:  05/15/2022   ID:  Daniel Frank, DOB [birth year], MRN [id number]  PCP:  Claretta Fraise, MD  Cardiologist:   Minus Breeding, MD Referring:  Claretta Fraise, MD  Chief Complaint  Patient presents with   Palpitations      History of Present Illness: Daniel Frank is a 70 y.o. male who presents for evaluation of palpitations and hypertension.  He has not had any prior cardiac history.  There was a workup years ago for apparent chest discomfort but I do not have any of those records.  He had a mention of some fleeting rare chest discomfort.  He also has had some palpitations but in retrospect he says that was really about a year ago.  He has not been having any palpitations, presyncope or syncope recently other than maybe waking up once in a while in the morning with this.  The last of these episodes was 2 weeks ago.  He has had blood pressures that have been creeping up and have been labile over the years.  He describes some of that to whether he was drinking alcohol or not and he is completely stopped this.  He has a lot of stress at home taking care of his 36 year old mom and dealing with her caregivers.  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  He does not have any new shortness of breath, PND or orthopnea.  He has been taking his blood pressures and they have been running in the 371G to 626R systolic routinely.  Diastolics are in the high 80s.  Of note he was in the emergency room in March.  Mowing the lawn in the did not feel well.  His blood pressure was elevated and he was concerned about this.  I reviewed these records for this visit.  He had a negative troponin.  EKG was unremarkable.   Past Medical History:  Diagnosis Date   Arthritis    Degenerative disc disease    Depressive disorder, not elsewhere classified    Eczema    Hyperactivity of bladder    Organic impotence     Other and unspecified hyperlipidemia    Rosacea    Sleep apnea    cpap   Spastic dysuria    Unspecified essential hypertension     Past Surgical History:  Procedure Laterality Date   COLONOSCOPY  09/20/2017     Current Outpatient Medications  Medication Sig Dispense Refill   atorvastatin (LIPITOR) 40 MG tablet Take 1 tablet (40 mg total) by mouth daily. 90 tablet 3   augmented betamethasone dipropionate (DIPROLENE-AF) 0.05 % ointment Apply topically daily as needed. 50 g 10   Multiple Vitamin (MULTIVITAMIN) tablet Take 1 tablet by mouth daily.     tamsulosin (FLOMAX) 0.4 MG CAPS capsule Take 2 capsules (0.8 mg total) by mouth daily after supper. 180 capsule 3   olmesartan (BENICAR) 40 MG tablet Take 1 tablet (40 mg total) by mouth daily. 90 tablet 3   Current Facility-Administered Medications  Medication Dose Route Frequency Provider Last Rate Last Admin   0.9 %  sodium chloride infusion  500 mL Intravenous Once Ladene Artist, MD        Allergies:   Patient has no known allergies.    Social History:  The patient  reports that he quit smoking about 15 years ago. His smoking use  included cigarettes. He has never used smokeless tobacco. He reports that he does not currently use alcohol after a past usage of about 11.0 standard drinks of alcohol per week. He reports that he does not use drugs.   Family History:  The patient's family history includes Cancer in his sister; Chronic bronchitis in his mother; Heart failure in his mother; Lung disease in his father; Obesity in his brother and sister; Uterine cancer in his sister.    ROS:  Please see the history of present illness.   Otherwise, review of systems are positive for none.   All other systems are reviewed and negative.    PHYSICAL EXAM: VS:  BP (!) 140/78 (BP Location: Right Arm, Patient Position: Sitting, Cuff Size: Normal)   Pulse 80   Ht '5\' 11"'$  (1.803 m)   Wt 226 lb (102.5 kg)   BMI 31.52 kg/m  , BMI Body mass  index is 31.52 kg/m. GENERAL:  Well appearing HEENT:  Pupils equal round and reactive, fundi not visualized, oral mucosa unremarkable NECK:  No jugular venous distention, waveform within normal limits, carotid upstroke brisk and symmetric, no bruits, no thyromegaly LYMPHATICS:  No cervical, inguinal adenopathy LUNGS:  Clear to auscultation bilaterally BACK:  No CVA tenderness CHEST:  Unremarkable HEART:  PMI not displaced or sustained,S1 and S2 within normal limits, no S3, no S4, no clicks, no rubs, no murmurs ABD:  Flat, positive bowel sounds normal in frequency in pitch, no bruits, no rebound, no guarding, no midline pulsatile mass, no hepatomegaly, no splenomegaly EXT:  2 plus pulses throughout, no edema, no cyanosis no clubbing SKIN:  No rashes no nodules NEURO:  Cranial nerves II through XII grossly intact, motor grossly intact throughout PSYCH:  Cognitively intact, oriented to person place and time    EKG:  EKG is ordered today. The ekg ordered today demonstrates sinus rhythm, rate 88, incomplete right bundle branch block, no acute ST-T wave changes.   Recent Labs: 05/04/2022: ALT 24; BUN 13; Creatinine, Ser 1.09; Hemoglobin 15.0; Platelets 159; Potassium 4.2; Sodium 144    Lipid Panel    Component Value Date/Time   CHOL 130 05/04/2022 1217   CHOL 172 12/26/2012 1538   TRIG 105 05/04/2022 1217   TRIG 64 10/02/2013 0856   TRIG 88 12/26/2012 1538   HDL 51 05/04/2022 1217   HDL 57 10/02/2013 0856   HDL 46 12/26/2012 1538   CHOLHDL 2.5 05/04/2022 1217   LDLCALC 60 05/04/2022 1217   LDLCALC 66 10/02/2013 0856   LDLCALC 108 (H) 12/26/2012 1538      Wt Readings from Last 3 Encounters:  05/15/22 226 lb (102.5 kg)  05/04/22 226 lb 12.8 oz (102.9 kg)  09/04/20 212 lb (96.2 kg)      Other studies Reviewed: Additional studies/ records that were reviewed today include: Labs. Review of the above records demonstrates:  Please see elsewhere in the note.     ASSESSMENT  AND PLAN:  Palpitation: He is complaining of infrequent palpitations.  I do see blood work to include normal electrolytes.  At this point since he is not having these frequently I would not consider monitor but he can let me know.  HTN: Blood pressure is elevated and I am going to increase him to 40 mg of Benicar.  He will keep blood pressure diary.  Chest pain: Actually this is very infrequent.  He does not have significant cardiovascular risk factors.  No change in therapy.  Abnormal EKG: He has incomplete  right bundle branch block but this is unchanged from previous.  No change in therapy.  Current medicines are reviewed at length with the patient today.  The patient does not have concerns regarding medicines.  The following changes have been made:  no change  Labs/ tests ordered today include:   Orders Placed This Encounter  Procedures   EKG 12-Lead     Disposition:   FU with me in 3 months in [place], Minus Breeding, MD  05/15/2022 11:50 AM    [place]

## 2022-05-15 ENCOUNTER — Encounter: Payer: Self-pay | Admitting: Cardiology

## 2022-05-15 ENCOUNTER — Ambulatory Visit: Payer: Medicare Other | Attending: Cardiology | Admitting: Cardiology

## 2022-05-15 VITALS — BP 140/78 | HR 80 | Ht 71.0 in | Wt 226.0 lb

## 2022-05-15 DIAGNOSIS — R002 Palpitations: Secondary | ICD-10-CM | POA: Diagnosis not present

## 2022-05-15 MED ORDER — OLMESARTAN MEDOXOMIL 40 MG PO TABS: 40.0000 mg | ORAL_TABLET | Freq: Every day | ORAL | 3 refills | Status: DC

## 2022-05-15 NOTE — Patient Instructions (Signed)
Medication Instructions:   INCREASE Olmesartan (Benicar) to 40 mg daily   *If you need a refill on your cardiac medications before your next appointment, please call your pharmacy*  Lab Work: NONE ordered at this time of appointment   If you have labs (blood work) drawn today and your tests are completely normal, you will receive your results only by: [place] (if you have MyChart) OR A paper copy in the mail If you have any lab test that is abnormal or we need to change your treatment, we will call you to review the results.  Testing/Procedures: NONE ordered at this time of appointment   Follow-Up: At [facility], you and your health needs are our priority.  As part of our continuing mission to provide you with exceptional heart care, we have created designated Provider Care Teams.  These Care Teams include your primary Cardiologist (physician) and Advanced Practice Providers (APPs -  Physician Assistants and Nurse Practitioners) who all work together to provide you with the care you need, when you need it.  Your next appointment:   3 month(s)  The format for your next appointment:   In Person  Provider:   Minus Breeding, MD    Other Instructions  Important Information About Sugar

## 2022-06-17 ENCOUNTER — Encounter: Payer: Self-pay | Admitting: Family Medicine

## 2022-06-17 ENCOUNTER — Ambulatory Visit (INDEPENDENT_AMBULATORY_CARE_PROVIDER_SITE_OTHER): Payer: Medicare Other | Admitting: Family Medicine

## 2022-06-17 VITALS — BP 138/76 | HR 83 | Temp 98.2°F | Ht 71.0 in | Wt 228.0 lb

## 2022-06-17 DIAGNOSIS — I10 Essential (primary) hypertension: Secondary | ICD-10-CM

## 2022-06-17 MED ORDER — OLMESARTAN MEDOXOMIL-HCTZ 40-25 MG PO TABS: 1.0000 | ORAL_TABLET | Freq: Every day | ORAL | 3 refills | Status: DC

## 2022-06-17 NOTE — Progress Notes (Signed)
Subjective:  Patient ID: Daniel Frank, male    DOB: Oct 08, 1950  Age: 72 y.o. MRN: [id number]  CC: Hypertension   HPI Daniel Frank presents for  presents for  follow-up of hypertension. Patient has no history of headache chest pain or shortness of breath or recent cough. Patient also denies symptoms of TIA such as focal numbness or weakness. Patient denies side effects from medication. States taking it regularly.      06/17/2022   11:47 AM 06/17/2022   11:38 AM 05/04/2022   11:17 AM  Depression screen PHQ 2/9  Decreased Interest 1 0 0  Down, Depressed, Hopeless 1 0 0  PHQ - 2 Score 2 0 0  Altered sleeping 0    Tired, decreased energy 1    Change in appetite 0    Feeling bad or failure about yourself  0    Trouble concentrating 0    Moving slowly or fidgety/restless 0    Suicidal thoughts 0    PHQ-9 Score 3    Difficult doing work/chores Somewhat difficult      History Rashied has a past medical history of Arthritis, Degenerative disc disease, Depressive disorder, not elsewhere classified, Eczema, Hyperactivity of bladder, Organic impotence, Other and unspecified hyperlipidemia, Rosacea, Sleep apnea, Spastic dysuria, and Unspecified essential hypertension.   He has a past surgical history that includes Colonoscopy (09/20/2017).   His family history includes Cancer in his sister; Chronic bronchitis in his mother; Heart failure in his mother; Lung disease in his father; Obesity in his brother and sister; Uterine cancer in his sister.He reports that he quit smoking about 15 years ago. His smoking use included cigarettes. He has never used smokeless tobacco. He reports that he does not currently use alcohol after a past usage of about 11.0 standard drinks of alcohol per week. He reports that he does not use drugs.    ROS Review of Systems  Constitutional:  Negative for fever.  Respiratory:  Negative for shortness of breath.   Cardiovascular:  Negative for chest pain.   Musculoskeletal:  Negative for arthralgias.  Skin:  Negative for rash.    Objective:  BP (!) 142/79   Pulse 83   Temp 98.2 F (36.8 C)   Ht '5\' 11"'$  (1.803 m)   Wt 228 lb (103.4 kg)   SpO2 95%   BMI 31.80 kg/m   BP Readings from Last 3 Encounters:  06/17/22 (!) 142/79  05/15/22 (!) 140/78  05/04/22 (!) 164/81    Wt Readings from Last 3 Encounters:  06/17/22 228 lb (103.4 kg)  05/15/22 226 lb (102.5 kg)  05/04/22 226 lb 12.8 oz (102.9 kg)     Physical Exam Vitals reviewed.  Constitutional:      Appearance: He is well-developed.  HENT:     Head: Normocephalic and atraumatic.     Right Ear: External ear normal.     Left Ear: External ear normal.     Mouth/Throat:     Pharynx: No oropharyngeal exudate or posterior oropharyngeal erythema.  Eyes:     Pupils: Pupils are equal, round, and reactive to light.  Cardiovascular:     Rate and Rhythm: Normal rate and regular rhythm.     Heart sounds: No murmur heard. Pulmonary:     Effort: No respiratory distress.     Breath sounds: Normal breath sounds.  Musculoskeletal:     Cervical back: Normal range of motion and neck supple.  Neurological:     Mental Status: He  is alert and oriented to person, place, and time.       Assessment & Plan:   Tremaine was seen today for hypertension.  Diagnoses and all orders for this visit:  Essential hypertension  Other orders -     olmesartan-hydrochlorothiazide (BENICAR HCT) 40-25 MG tablet; Take 1 tablet by mouth daily.       I have discontinued Tykel T. Standley's olmesartan. I am also having him start on olmesartan-hydrochlorothiazide. Additionally, I am having him maintain his multivitamin, atorvastatin, augmented betamethasone dipropionate, and tamsulosin. We will continue to administer sodium chloride.  Allergies as of 06/17/2022   No Known Allergies      Medication List        Accurate as of June 17, 2022  8:30 PM. If you have any questions, ask your nurse or  doctor.          STOP taking these medications    olmesartan 40 MG tablet Commonly known as: BENICAR Stopped by: Claretta Fraise, MD       TAKE these medications    atorvastatin 40 MG tablet Commonly known as: LIPITOR Take 1 tablet (40 mg total) by mouth daily.   augmented betamethasone dipropionate 0.05 % ointment Commonly known as: DIPROLENE-AF Apply topically daily as needed.   multivitamin tablet Take 1 tablet by mouth daily.   olmesartan-hydrochlorothiazide 40-25 MG tablet Commonly known as: Benicar HCT Take 1 tablet by mouth daily. Started by: Claretta Fraise, MD   tamsulosin 0.4 MG Caps capsule Commonly known as: FLOMAX Take 2 capsules (0.8 mg total) by mouth daily after supper.         Follow-up: Return in about 6 months (around 12/16/2022) for hypertension.  Claretta Fraise, M.D.

## 2022-08-12 ENCOUNTER — Ambulatory Visit: Payer: Medicare Other | Admitting: Family Medicine

## 2022-08-12 ENCOUNTER — Encounter: Payer: Self-pay | Admitting: Family Medicine

## 2022-08-12 VITALS — BP 170/91 | HR 101 | Temp 97.7°F | Ht 71.0 in | Wt 220.6 lb

## 2022-08-12 DIAGNOSIS — J069 Acute upper respiratory infection, unspecified: Secondary | ICD-10-CM

## 2022-08-12 DIAGNOSIS — I10 Essential (primary) hypertension: Secondary | ICD-10-CM

## 2022-08-12 MED ORDER — GUAIFENESIN ER 600 MG PO TB12: 600.0000 mg | ORAL_TABLET | Freq: Two times a day (BID) | ORAL | 0 refills | Status: AC

## 2022-08-12 MED ORDER — OLMESARTAN MEDOXOMIL-HCTZ 40-25 MG PO TABS: 1.0000 | ORAL_TABLET | Freq: Every day | ORAL | 1 refills | Status: DC

## 2022-08-12 MED ORDER — AMOXICILLIN-POT CLAVULANATE 875-125 MG PO TABS: 1.0000 | ORAL_TABLET | Freq: Two times a day (BID) | ORAL | 0 refills | Status: AC

## 2022-08-12 NOTE — Progress Notes (Signed)
Subjective:  Patient ID: Daniel Frank, male    DOB: Dec 25, 1950, 72 y.o.   MRN: [id number]  Patient Care Team: Claretta Fraise, MD as PCP - General (Family Medicine) Minus Breeding, MD as PCP - Cardiology (Cardiology) Ladene Artist, MD as Attending Physician (Gastroenterology) Deneise Lever, MD as Attending Physician (Pulmonary Disease) Ricard Dillon, MD (Psychiatry) Clent Jacks, MD (Ophthalmology)   Chief Complaint:  Weakness (X 4 weeks after having covid )   HPI: Daniel Frank is a 72 y.o. male presenting on 08/12/2022 for Weakness (X 4 weeks after having covid )   Weakness This is a new problem. Episode onset: 4 weeks ago. started after having COVID. States he has also been caring for his mother and this has worn him out. The problem occurs constantly. Associated symptoms include congestion, coughing and weakness. Pertinent negatives include no abdominal pain, anorexia, arthralgias, change in bowel habit, chest pain, chills, diaphoresis, fatigue, fever, headaches, joint swelling, myalgias, nausea, neck pain, numbness, rash, sore throat, swollen glands, urinary symptoms, vertigo, visual change or vomiting. The symptoms are aggravated by stress. He has tried nothing for the symptoms. The treatment provided no relief.  URI  Episode onset: 4 weeks ago. The problem has been waxing and waning. Associated symptoms include congestion, coughing, rhinorrhea and wheezing. Pertinent negatives include no abdominal pain, chest pain, diarrhea, dysuria, ear pain, headaches, joint pain, joint swelling, nausea, neck pain, plugged ear sensation, rash, sinus pain, sneezing, sore throat, swollen glands or vomiting. He has tried nothing for the symptoms. The treatment provided no relief.  He also reports his blood pressure has been elevated. He states Dr. Livia Snellen added HCTZ to his medications but he has not picked this up and started it. He was seen by cardiology and told to monitor his BP.  States he has been and it remains elevated.   Relevant past medical, surgical, family, and social history reviewed and updated as indicated.  Allergies and medications reviewed and updated. Data reviewed: Chart in Epic.   Past Medical History:  Diagnosis Date   Arthritis    Degenerative disc disease    Depressive disorder, not elsewhere classified    Eczema    Hyperactivity of bladder    Organic impotence    Other and unspecified hyperlipidemia    Rosacea    Sleep apnea    cpap   Spastic dysuria    Unspecified essential hypertension     Past Surgical History:  Procedure Laterality Date   COLONOSCOPY  09/20/2017    Social History   Socioeconomic History   Marital status: Single    Spouse name: Not on file   Number of children: 0   Years of education: Tech sch   Highest education level: Not on file  Occupational History   Occupation: Runner, broadcasting/film/video: UNEMPLOYED  Tobacco Use   Smoking status: Former    Types: Cigarettes    Quit date: 07/27/2006    Years since quitting: 16.0   Smokeless tobacco: Never  Vaping Use   Vaping Use: Never used  Substance and Sexual Activity   Alcohol use: Not Currently    Alcohol/week: 11.0 standard drinks of alcohol    Types: 6 Cans of beer, 5 Shots of liquor per week   Drug use: No   Sexual activity: Not on file  Other Topics Concern   Not on file  Social History Narrative   Patient lives at home with his mother.  Caffeine  Use: 2 cups of coffee daily and some soda   Social Determinants of Health   Financial Resource Strain: Not on file  Food Insecurity: Not on file  Transportation Needs: Not on file  Physical Activity: Not on file  Stress: Not on file  Social Connections: Not on file  Intimate Partner Violence: Not on file    Outpatient Encounter Medications as of 08/12/2022  Medication Sig   amoxicillin-clavulanate (AUGMENTIN) 875-125 MG tablet Take 1 tablet by mouth 2 (two) times daily for 10 days.    atorvastatin (LIPITOR) 40 MG tablet Take 1 tablet (40 mg total) by mouth daily.   augmented betamethasone dipropionate (DIPROLENE-AF) 0.05 % ointment Apply topically daily as needed.   guaiFENesin (MUCINEX) 600 MG 12 hr tablet Take 1 tablet (600 mg total) by mouth 2 (two) times daily for 10 days.   Multiple Vitamin (MULTIVITAMIN) tablet Take 1 tablet by mouth daily.   tamsulosin (FLOMAX) 0.4 MG CAPS capsule Take 2 capsules (0.8 mg total) by mouth daily after supper.   [DISCONTINUED] olmesartan-hydrochlorothiazide (BENICAR HCT) 40-25 MG tablet Take 1 tablet by mouth daily.   olmesartan-hydrochlorothiazide (BENICAR HCT) 40-25 MG tablet Take 1 tablet by mouth daily.   Facility-Administered Encounter Medications as of 08/12/2022  Medication   0.9 %  sodium chloride infusion    No Known Allergies  Review of Systems  Constitutional:  Positive for activity change and appetite change. Negative for chills, diaphoresis, fatigue, fever and unexpected weight change.  HENT:  Positive for congestion, postnasal drip and rhinorrhea. Negative for dental problem, drooling, ear discharge, ear pain, facial swelling, hearing loss, mouth sores, nosebleeds, sinus pressure, sinus pain, sneezing, sore throat, tinnitus, trouble swallowing and voice change.   Eyes:  Negative for photophobia and visual disturbance.  Respiratory:  Positive for cough and wheezing. Negative for apnea, choking, chest tightness, shortness of breath and stridor.   Cardiovascular:  Negative for chest pain, palpitations and leg swelling.  Gastrointestinal:  Negative for abdominal pain, anorexia, change in bowel habit, diarrhea, nausea and vomiting.  Genitourinary:  Negative for decreased urine volume, difficulty urinating and dysuria.  Musculoskeletal:  Negative for arthralgias, joint pain, joint swelling, myalgias and neck pain.  Skin:  Negative for rash.  Neurological:  Positive for weakness. Negative for dizziness, vertigo, tremors, seizures,  syncope, facial asymmetry, speech difficulty, light-headedness, numbness and headaches.  Psychiatric/Behavioral:  Negative for confusion.   All other systems reviewed and are negative.       Objective:  BP (!) 170/91   Pulse (!) 101   Temp 97.7 F (36.5 C) (Temporal)   Ht '5\' 11"'$  (1.803 m)   Wt 220 lb 9.6 oz (100.1 kg)   SpO2 95%   BMI 30.77 kg/m    Wt Readings from Last 3 Encounters:  08/12/22 220 lb 9.6 oz (100.1 kg)  06/17/22 228 lb (103.4 kg)  05/15/22 226 lb (102.5 kg)    Physical Exam Vitals and nursing note reviewed.  Constitutional:      General: He is not in acute distress.    Appearance: Normal appearance. He is well-developed and well-groomed. He is obese. He is not ill-appearing, toxic-appearing or diaphoretic.  HENT:     Head: Normocephalic and atraumatic.     Jaw: There is normal jaw occlusion.     Right Ear: Hearing normal.     Left Ear: Hearing normal.     Nose: Congestion present.     Mouth/Throat:     Lips: Pink.     Mouth:  Mucous membranes are moist.     Pharynx: Oropharynx is clear. Uvula midline. Posterior oropharyngeal erythema present. No oropharyngeal exudate.  Eyes:     General: Lids are normal.     Extraocular Movements: Extraocular movements intact.     Conjunctiva/sclera: Conjunctivae normal.     Pupils: Pupils are equal, round, and reactive to light.  Neck:     Thyroid: No thyroid mass, thyromegaly or thyroid tenderness.     Vascular: No carotid bruit or JVD.     Trachea: Trachea and phonation normal.  Cardiovascular:     Rate and Rhythm: Normal rate and regular rhythm.     Chest Wall: PMI is not displaced.     Pulses: Normal pulses.     Heart sounds: Normal heart sounds. No murmur heard.    No friction rub. No gallop.  Pulmonary:     Effort: Pulmonary effort is normal. No respiratory distress.     Breath sounds: No stridor. Wheezing present. No rhonchi or rales.  Chest:     Chest wall: No tenderness.  Abdominal:     General:  There is no abdominal bruit.     Palpations: There is no hepatomegaly or splenomegaly.  Musculoskeletal:        General: Normal range of motion.     Cervical back: Normal range of motion and neck supple.     Right lower leg: No edema.     Left lower leg: No edema.  Lymphadenopathy:     Cervical: No cervical adenopathy.  Skin:    General: Skin is warm and dry.     Capillary Refill: Capillary refill takes less than 2 seconds.     Coloration: Skin is not cyanotic, jaundiced or pale.     Findings: No rash.  Neurological:     General: No focal deficit present.     Mental Status: He is alert and oriented to person, place, and time.     Sensory: Sensation is intact.     Motor: Motor function is intact.     Coordination: Coordination is intact.     Gait: Gait is intact.     Deep Tendon Reflexes: Reflexes are normal and symmetric.  Psychiatric:        Attention and Perception: Attention and perception normal.        Mood and Affect: Mood and affect normal.        Speech: Speech normal.        Behavior: Behavior normal. Behavior is cooperative.        Thought Content: Thought content normal.        Cognition and Memory: Cognition and memory normal.        Judgment: Judgment normal.     Results for orders placed or performed in visit on 05/04/22  CBC with Differential/Platelet  Result Value Ref Range   WBC 5.8 3.4 - 10.8 x10E3/uL   RBC 4.61 4.14 - 5.80 x10E6/uL   Hemoglobin 15.0 13.0 - 17.7 g/dL   Hematocrit 43.9 37.5 - 51.0 %   MCV 95 79 - 97 fL   MCH 32.5 26.6 - 33.0 pg   MCHC 34.2 31.5 - 35.7 g/dL   RDW 12.1 11.6 - 15.4 %   Platelets 159 150 - 450 x10E3/uL   Neutrophils 65 Not Estab. %   Lymphs 21 Not Estab. %   Monocytes 9 Not Estab. %   Eos 4 Not Estab. %   Basos 1 Not Estab. %   Neutrophils Absolute 3.7  1.4 - 7.0 x10E3/uL   Lymphocytes Absolute 1.2 0.7 - 3.1 x10E3/uL   Monocytes Absolute 0.5 0.1 - 0.9 x10E3/uL   EOS (ABSOLUTE) 0.3 0.0 - 0.4 x10E3/uL   Basophils  Absolute 0.1 0.0 - 0.2 x10E3/uL   Immature Granulocytes 0 Not Estab. %   Immature Grans (Abs) 0.0 0.0 - 0.1 x10E3/uL  CMP14+EGFR  Result Value Ref Range   Glucose 121 (H) 70 - 99 mg/dL   BUN 13 8 - 27 mg/dL   Creatinine, Ser 1.09 0.76 - 1.27 mg/dL   eGFR 73 >59 mL/min/1.73   BUN/Creatinine Ratio 12 10 - 24   Sodium 144 134 - 144 mmol/L   Potassium 4.2 3.5 - 5.2 mmol/L   Chloride 105 96 - 106 mmol/L   CO2 25 20 - 29 mmol/L   Calcium 9.3 8.6 - 10.2 mg/dL   Total Protein 6.2 6.0 - 8.5 g/dL   Albumin 4.5 3.9 - 4.9 g/dL   Globulin, Total 1.7 1.5 - 4.5 g/dL   Albumin/Globulin Ratio 2.6 (H) 1.2 - 2.2   Bilirubin Total 0.5 0.0 - 1.2 mg/dL   Alkaline Phosphatase 85 44 - 121 IU/L   AST 21 0 - 40 IU/L   ALT 24 0 - 44 IU/L  Lipid panel  Result Value Ref Range   Cholesterol, Total 130 100 - 199 mg/dL   Triglycerides 105 0 - 149 mg/dL   HDL 51 >39 mg/dL   VLDL Cholesterol Cal 19 5 - 40 mg/dL   LDL Chol Calc (NIH) 60 0 - 99 mg/dL   Chol/HDL Ratio 2.5 0.0 - 5.0 ratio  Bayer DCA Hb A1c Waived  Result Value Ref Range   HB A1C (BAYER DCA - WAIVED) 5.9 (H) 4.8 - 5.6 %       Pertinent labs & imaging results that were available during my care of the patient were reviewed by me and considered in my medical decision making.  Assessment & Plan:  Daniel Frank was seen today for weakness.  Diagnoses and all orders for this visit:  URI with cough and congestion Ongoing and worsening for 4 weeks. Will initiate below. Aware to drink plenty of fluids. Report new, worsening, or persistent symptoms.  -     amoxicillin-clavulanate (AUGMENTIN) 875-125 MG tablet; Take 1 tablet by mouth 2 (two) times daily for 10 days. -     guaiFENesin (MUCINEX) 600 MG 12 hr tablet; Take 1 tablet (600 mg total) by mouth 2 (two) times daily for 10 days.  Essential hypertension Has not started medications prescribed by PCP. Blood pressure remains elevated. Will resend in medications prescribed by PCP. Pt aware to take as  prescribed. DASH diet encouraged. Follow up in 2 weeks with PCP for reevaluation.  -     olmesartan-hydrochlorothiazide (BENICAR HCT) 40-25 MG tablet; Take 1 tablet by mouth daily.     Continue all other maintenance medications.  Follow up plan: Return in about 2 weeks (around 08/26/2022), or if symptoms worsen or fail to improve, for Stacks for HTN.   Continue healthy lifestyle choices, including diet (rich in fruits, vegetables, and lean proteins, and low in salt and simple carbohydrates) and exercise (at least 30 minutes of moderate physical activity daily).  Educational handout given for DASH eating plan  The above assessment and management plan was discussed with the patient. The patient verbalized understanding of and has agreed to the management plan. Patient is aware to call the clinic if they develop any new symptoms or if symptoms persist  or worsen. Patient is aware when to return to the clinic for a follow-up visit. Patient educated on when it is appropriate to go to the emergency department.   Monia Pouch, FNP-C [facility] [phone]

## 2022-08-19 ENCOUNTER — Ambulatory Visit: Payer: Medicare Other | Admitting: Cardiology

## 2022-08-20 ENCOUNTER — Encounter: Payer: Self-pay | Admitting: Gastroenterology

## 2022-08-31 ENCOUNTER — Encounter: Payer: Self-pay | Admitting: Family Medicine

## 2022-08-31 ENCOUNTER — Ambulatory Visit: Payer: Medicare Other | Admitting: Family Medicine

## 2022-08-31 VITALS — BP 119/70 | HR 93 | Temp 98.6°F | Ht 71.0 in | Wt 219.8 lb

## 2022-08-31 DIAGNOSIS — E782 Mixed hyperlipidemia: Secondary | ICD-10-CM

## 2022-08-31 DIAGNOSIS — I10 Essential (primary) hypertension: Secondary | ICD-10-CM

## 2022-08-31 DIAGNOSIS — R739 Hyperglycemia, unspecified: Secondary | ICD-10-CM

## 2022-08-31 MED ORDER — OLMESARTAN MEDOXOMIL 40 MG PO TABS: 40.0000 mg | ORAL_TABLET | Freq: Every day | ORAL | 1 refills | Status: DC

## 2022-08-31 NOTE — Progress Notes (Signed)
Subjective:  Patient ID: Daniel Frank, male    DOB: Feb 09, 1951  Age: 72 y.o. MRN: [id number]  CC: Hypertension   HPI Daniel Frank presents for  follow-up of hypertension. Patient has no history of headache chest pain or shortness of breath or recent cough. Patient also denies symptoms of TIA such as focal numbness or weakness. Patient denies side effects from medication. States taking it regularly. Tried Benicar HCT and SBP dropped below 90. Felt weak and dizzy. Went ack to the benicar 40.  Long term prediabetes. Highest  Aic has been 6.1.    History Janell has a past medical history of Arthritis, Degenerative disc disease, Depressive disorder, not elsewhere classified, Eczema, Hyperactivity of bladder, Organic impotence, Other and unspecified hyperlipidemia, Rosacea, Sleep apnea, Spastic dysuria, and Unspecified essential hypertension.   He has a past surgical history that includes Colonoscopy (09/20/2017).   His family history includes Cancer in his sister; Chronic bronchitis in his mother; Heart failure in his mother; Lung disease in his father; Obesity in his brother and sister; Uterine cancer in his sister.He reports that he quit smoking about 16 years ago. His smoking use included cigarettes. He has never used smokeless tobacco. He reports that he does not currently use alcohol after a past usage of about 11.0 standard drinks of alcohol per week. He reports that he does not use drugs.  Current Outpatient Medications on File Prior to Visit  Medication Sig Dispense Refill   atorvastatin (LIPITOR) 40 MG tablet Take 1 tablet (40 mg total) by mouth daily. 90 tablet 3   augmented betamethasone dipropionate (DIPROLENE-AF) 0.05 % ointment Apply topically daily as needed. 50 g 10   Multiple Vitamin (MULTIVITAMIN) tablet Take 1 tablet by mouth daily.     tamsulosin (FLOMAX) 0.4 MG CAPS capsule Take 2 capsules (0.8 mg total) by mouth daily after supper. 180 capsule 3   Current  Facility-Administered Medications on File Prior to Visit  Medication Dose Route Frequency Provider Last Rate Last Admin   0.9 %  sodium chloride infusion  500 mL Intravenous Once Ladene Artist, MD        ROS Review of Systems  Constitutional:  Negative for fever.  Respiratory:  Negative for shortness of breath.   Cardiovascular:  Negative for chest pain.  Musculoskeletal:  Negative for arthralgias.  Skin:  Negative for rash.    Objective:  BP 119/70   Pulse 93   Temp 98.6 F (37 C)   Ht 5\' 11"  (1.803 m)   Wt 219 lb 12.8 oz (99.7 kg)   SpO2 96%   BMI 30.66 kg/m   BP Readings from Last 3 Encounters:  08/31/22 119/70  08/12/22 (!) 170/91  06/23/22 138/76    Wt Readings from Last 3 Encounters:  08/31/22 219 lb 12.8 oz (99.7 kg)  08/12/22 220 lb 9.6 oz (100.1 kg)  06/17/22 228 lb (103.4 kg)     Physical Exam Vitals reviewed.  Constitutional:      Appearance: He is well-developed.  HENT:     Head: Normocephalic and atraumatic.     Right Ear: External ear normal.     Left Ear: External ear normal.     Mouth/Throat:     Pharynx: No oropharyngeal exudate or posterior oropharyngeal erythema.  Eyes:     Pupils: Pupils are equal, round, and reactive to light.  Cardiovascular:     Rate and Rhythm: Normal rate and regular rhythm.     Heart sounds: No murmur heard.  Pulmonary:     Effort: No respiratory distress.     Breath sounds: Normal breath sounds.  Musculoskeletal:     Cervical back: Normal range of motion and neck supple.  Neurological:     Mental Status: He is alert and oriented to person, place, and time.       Assessment & Plan:   Andros was seen today for hypertension.  Diagnoses and all orders for this visit:  Essential hypertension -     CMP14+EGFR  Hyperglycemia -     CMP14+EGFR  Mixed hyperlipidemia -     CMP14+EGFR  Other orders -     olmesartan (BENICAR) 40 MG tablet; Take 1 tablet (40 mg total) by mouth daily.   Allergies as of  08/31/2022   No Known Allergies      Medication List        Accurate as of August 31, 2022  2:29 PM. If you have any questions, ask your nurse or doctor.          STOP taking these medications    olmesartan-hydrochlorothiazide 40-25 MG tablet Commonly known as: Benicar HCT Stopped by: Claretta Fraise, MD       TAKE these medications    atorvastatin 40 MG tablet Commonly known as: LIPITOR Take 1 tablet (40 mg total) by mouth daily.   augmented betamethasone dipropionate 0.05 % ointment Commonly known as: DIPROLENE-AF Apply topically daily as needed.   multivitamin tablet Take 1 tablet by mouth daily.   olmesartan 40 MG tablet Commonly known as: BENICAR Take 1 tablet (40 mg total) by mouth daily.   tamsulosin 0.4 MG Caps capsule Commonly known as: FLOMAX Take 2 capsules (0.8 mg total) by mouth daily after supper.        Meds ordered this encounter  Medications   olmesartan (BENICAR) 40 MG tablet    Sig: Take 1 tablet (40 mg total) by mouth daily.    Dispense:  90 tablet    Refill:  1      Follow-up: Return in about 6 months (around 03/03/2023).  Claretta Fraise, M.D.

## 2022-09-01 LAB — CMP14+EGFR
ALT: 19 IU/L (ref 0–44)
AST: 18 IU/L (ref 0–40)
Albumin/Globulin Ratio: 2 (ref 1.2–2.2)
Albumin: 4.1 g/dL (ref 3.9–4.9)
Alkaline Phosphatase: 75 IU/L (ref 44–121)
BUN/Creatinine Ratio: 12 (ref 10–24)
BUN: 13 mg/dL (ref 8–27)
Bilirubin Total: 0.5 mg/dL (ref 0.0–1.2)
CO2: 26 mmol/L (ref 20–29)
Calcium: 9.5 mg/dL (ref 8.6–10.2)
Chloride: 103 mmol/L (ref 96–106)
Creatinine, Ser: 1.08 mg/dL (ref 0.76–1.27)
Globulin, Total: 2.1 g/dL (ref 1.5–4.5)
Glucose: 125 mg/dL — ABNORMAL HIGH (ref 70–99)
Potassium: 4.4 mmol/L (ref 3.5–5.2)
Sodium: 143 mmol/L (ref 134–144)
Total Protein: 6.2 g/dL (ref 6.0–8.5)
eGFR: 74 mL/min/{1.73_m2} (ref >59)

## 2022-09-01 NOTE — Progress Notes (Signed)
Hello Maikel,  Your lab result is normal and/or stable.Some minor variations that are not significant are commonly marked abnormal, but do not represent any medical problem for you.  Best regards, Anyi Fels, M.D.

## 2022-09-09 ENCOUNTER — Ambulatory Visit (INDEPENDENT_AMBULATORY_CARE_PROVIDER_SITE_OTHER): Payer: Medicare Other

## 2022-09-09 VITALS — Ht 71.0 in | Wt 215.0 lb

## 2022-09-09 DIAGNOSIS — Z0000 Encounter for general adult medical examination without abnormal findings: Secondary | ICD-10-CM

## 2022-09-09 NOTE — Progress Notes (Signed)
Subjective:   Daniel Frank is a 72 y.o. male who presents for an Initial Medicare Annual Wellness Visit. I connected with  Daniel Frank on 09/09/22 by a audio enabled telemedicine application and verified that I am speaking with the correct person using two identifiers.  Patient Location: Home  Provider Location: Home Office  I discussed the limitations of evaluation and management by telemedicine. The patient expressed understanding and agreed to proceed.  Review of Systems     Cardiac Risk Factors include: advanced age (>76men, >69 women);male gender;hypertension;dyslipidemia     Objective:    Today's Vitals   09/09/22 1319  Weight: 215 lb (97.5 kg)  Height: 5\' 11"  (1.803 m)   Body mass index is 29.99 kg/m.     09/09/2022    1:22 PM 09/04/2020    2:10 PM 12/29/2018    4:30 AM  Advanced Directives  Does Patient Have a Medical Advance Directive? No No No  Would patient like information on creating a medical advance directive? No - Patient declined  No - Guardian declined    Current Medications (verified) Outpatient Encounter Medications as of 09/09/2022  Medication Sig   atorvastatin (LIPITOR) 40 MG tablet Take 1 tablet (40 mg total) by mouth daily.   augmented betamethasone dipropionate (DIPROLENE-AF) 0.05 % ointment Apply topically daily as needed.   Multiple Vitamin (MULTIVITAMIN) tablet Take 1 tablet by mouth daily.   olmesartan (BENICAR) 40 MG tablet Take 1 tablet (40 mg total) by mouth daily.   tamsulosin (FLOMAX) 0.4 MG CAPS capsule Take 2 capsules (0.8 mg total) by mouth daily after supper.   Facility-Administered Encounter Medications as of 09/09/2022  Medication   0.9 %  sodium chloride infusion    Allergies (verified) Patient has no known allergies.   History: Past Medical History:  Diagnosis Date   Arthritis    Degenerative disc disease    Depressive disorder, not elsewhere classified    Eczema    Hyperactivity of bladder    Organic impotence     Other and unspecified hyperlipidemia    Rosacea    Sleep apnea    cpap   Spastic dysuria    Unspecified essential hypertension    Past Surgical History:  Procedure Laterality Date   COLONOSCOPY  09/20/2017   Family History  Problem Relation Age of Onset   Heart failure Mother    Chronic bronchitis Mother    Lung disease Father    Uterine cancer Sister    Cancer Sister        uterine   Obesity Sister    Obesity Brother    Colon cancer Neg Hx    Stomach cancer Neg Hx    Esophageal cancer Neg Hx    Pancreatic cancer Neg Hx    Liver disease Neg Hx    Social History   Socioeconomic History   Marital status: Single    Spouse name: Not on file   Number of children: 0   Years of education: Tech sch   Highest education level: Not on file  Occupational History   Occupation: Runner, broadcasting/film/video: UNEMPLOYED  Tobacco Use   Smoking status: Former    Types: Cigarettes    Quit date: 07/27/2006    Years since quitting: 16.1   Smokeless tobacco: Never  Vaping Use   Vaping Use: Never used  Substance and Sexual Activity   Alcohol use: Not Currently    Alcohol/week: 11.0 standard drinks of alcohol  Types: 6 Cans of beer, 5 Shots of liquor per week   Drug use: No   Sexual activity: Not on file  Other Topics Concern   Not on file  Social History Narrative   Patient lives at home with his mother.  Caffeine  Use: 2 cups of coffee daily and some soda   Social Determinants of Health   Financial Resource Strain: Low Risk  (09/09/2022)   Overall Financial Resource Strain (CARDIA)    Difficulty of Paying Living Expenses: Not hard at all  Food Insecurity: No Food Insecurity (09/09/2022)   Hunger Vital Sign    Worried About Running Out of Food in the Last Year: Never true    Ran Out of Food in the Last Year: Never true  Transportation Needs: No Transportation Needs (09/09/2022)   PRAPARE - Hydrologist (Medical): No    Lack of Transportation  (Non-Medical): No  Physical Activity: Insufficiently Active (09/09/2022)   Exercise Vital Sign    Days of Exercise per Week: 3 days    Minutes of Exercise per Session: 30 min  Stress: No Stress Concern Present (09/09/2022)   [place]    Feeling of Stress : Not at all  Social Connections: Moderately Isolated (09/09/2022)   Social Connection and Isolation Panel [NHANES]    Frequency of Communication with Friends and Family: More than three times a week    Frequency of Social Gatherings with Friends and Family: More than three times a week    Attends Religious Services: More than 4 times per year    Active Member of [employer] or Organizations: No    Attends Music therapist: Never    Marital Status: Never married    Tobacco Counseling Counseling given: Not Answered   Clinical Intake:  Pre-visit preparation completed: Yes  Pain : No/denies pain     Nutritional Risks: None Diabetes: No  How often do you need to have someone help you when you read instructions, pamphlets, or other written materials from your doctor or pharmacy?: 1 - Never  Diabetic?no   Interpreter Needed?: No  Information entered by :: Jadene Pierini, LPN   Activities of Daily Living    09/09/2022    1:22 PM  In your present state of health, do you have any difficulty performing the following activities:  Hearing? 0  Vision? 0  Difficulty concentrating or making decisions? 0  Walking or climbing stairs? 0  Dressing or bathing? 0  Doing errands, shopping? 0  Preparing Food and eating ? N  Using the Toilet? N  In the past six months, have you accidently leaked urine? N  Do you have problems with loss of bowel control? N  Managing your Medications? N  Managing your Finances? N  Housekeeping or managing your Housekeeping? N    Patient Care Team: Claretta Fraise, MD as PCP - General (Family Medicine) Minus Breeding, MD as PCP -  Cardiology (Cardiology) Ladene Artist, MD as Attending Physician (Gastroenterology) Deneise Lever, MD as Attending Physician (Pulmonary Disease) Ricard Dillon, MD (Psychiatry) Clent Jacks, MD (Ophthalmology)  Indicate any recent Medical Services you may have received from other than [facility] providers in the past year (date may be approximate).     Assessment:   This is a routine wellness examination for Daniel Frank.  Hearing/Vision screen Vision Screening - Comments:: Wears rx glasses - up to date with routine eye exams with  Dr.groat  Dietary issues and exercise activities discussed: Current Exercise Habits: Home exercise routine, Type of exercise: walking, Time (Minutes): 30, Frequency (Times/Week): 3, Weekly Exercise (Minutes/Week): 90, Intensity: Mild, Exercise limited by: None identified   Goals Addressed             This Visit's Progress    Exercise 3x per week (30 min per time)         Depression Screen    09/09/2022    1:20 PM 08/31/2022    2:06 PM 06/17/2022   11:47 AM 06/17/2022   11:38 AM 05/04/2022   11:17 AM  PHQ 2/9 Scores  PHQ - 2 Score 0 1 2 0 0  PHQ- 9 Score 0 2 3      Fall Risk    09/09/2022    1:19 PM 06/17/2022   11:38 AM 05/04/2022   11:17 AM  Fall Risk   Falls in the past year? 0 0 0  Number falls in past yr: 0    Injury with Fall? 0    Risk for fall due to : No Fall Risks    Follow up Falls prevention discussed      FALL RISK PREVENTION PERTAINING TO THE HOME:  Any stairs in or around the home? Yes  If so, are there any without handrails? No  Home free of loose throw rugs in walkways, pet beds, electrical cords, etc? Yes  Adequate lighting in your home to reduce risk of falls? Yes   ASSISTIVE DEVICES UTILIZED TO PREVENT FALLS:  Life alert? No  Use of a cane, walker or w/c? No  Grab bars in the bathroom? No  Shower chair or bench in shower? No  Elevated toilet seat or a handicapped toilet? No          09/09/2022    1:22 PM   6CIT Screen  What Year? 0 points  What month? 0 points  What time? 0 points  Count back from 20 0 points  Months in reverse 0 points  Repeat phrase 0 points  Total Score 0 points    Immunizations Immunization History  Administered Date(s) Administered   Fluad Quad(high Dose 65+) 04/24/2022   Influenza Split 05/28/2011   Influenza,inj,Quad PF,6+ Mos 04/07/2013   Tdap 08/07/2006    TDAP status: Due, Education has been provided regarding the importance of this vaccine. Advised may receive this vaccine at local pharmacy or Health Dept. Aware to provide a copy of the vaccination record if obtained from local pharmacy or Health Dept. Verbalized acceptance and understanding.  Flu Vaccine status: Up to date  Pneumococcal vaccine status: Due, Education has been provided regarding the importance of this vaccine. Advised may receive this vaccine at local pharmacy or Health Dept. Aware to provide a copy of the vaccination record if obtained from local pharmacy or Health Dept. Verbalized acceptance and understanding.  Covid-19 vaccine status: Completed vaccines  Qualifies for Shingles Vaccine? Yes   Zostavax completed No   Shingrix Completed?: No.    Education has been provided regarding the importance of this vaccine. Patient has been advised to call insurance company to determine out of pocket expense if they have not yet received this vaccine. Advised may also receive vaccine at local pharmacy or Health Dept. Verbalized acceptance and understanding.  Screening Tests Health Maintenance  Topic Date Due   COVID-19 Vaccine (1) Never done   OPHTHALMOLOGY EXAM  Never done   Zoster Vaccines- Shingrix (1 of 2) Never done   FOOT EXAM  01/03/2014   DTaP/Tdap/Td (2 - Td or Tdap) 08/06/2016   Pneumonia Vaccine 77+ Years old (1 of 1 - PCV) 05/05/2023 (Originally 02/20/2018)   Hepatitis C Screening  05/05/2023 (Originally 02/21/1969)   Diabetic kidney evaluation - Urine ACR  05/05/2027 (Originally  02/21/1969)   COLONOSCOPY (Pts 45-30yrs Insurance coverage will need to be confirmed)  09/21/2022   HEMOGLOBIN A1C  11/02/2022   INFLUENZA VACCINE  01/07/2023   Diabetic kidney evaluation - eGFR measurement  08/31/2023   Medicare Annual Wellness (AWV)  09/09/2023   HPV VACCINES  Aged Out    Health Maintenance  Health Maintenance Due  Topic Date Due   COVID-19 Vaccine (1) Never done   OPHTHALMOLOGY EXAM  Never done   Zoster Vaccines- Shingrix (1 of 2) Never done   FOOT EXAM  01/03/2014   DTaP/Tdap/Td (2 - Td or Tdap) 08/06/2016    Colorectal cancer screening: Type of screening: Colonoscopy. Completed 09/20/2017. Repeat every 5 patient received letter and will call schedule  years  Lung Cancer Screening: (Low Dose CT Chest recommended if Age 7-80 years, 30 pack-year currently smoking OR have quit w/in 15years.) does not qualify.   Lung Cancer Screening Referral: n/a  Additional Screening:  Hepatitis C Screening: does not qualify;   Vision Screening: Recommended annual ophthalmology exams for early detection of glaucoma and other disorders of the eye. Is the patient up to date with their annual eye exam?  Yes  Who is the provider or what is the name of the office in which the patient attends annual eye exams? Dr.Groat  If pt is not established with a provider, would they like to be referred to a provider to establish care? No .   Dental Screening: Recommended annual dental exams for proper oral hygiene  Community Resource Referral / Chronic Care Management: CRR required this visit?  No   CCM required this visit?  No      Plan:     I have personally reviewed and noted the following in the patient's chart:   Medical and social history Use of alcohol, tobacco or illicit drugs  Current medications and supplements including opioid prescriptions. Patient is not currently taking opioid prescriptions. Functional ability and status Nutritional status Physical  activity Advanced directives List of other physicians Hospitalizations, surgeries, and ER visits in previous 12 months Vitals Screenings to include cognitive, depression, and falls Referrals and appointments  In addition, I have reviewed and discussed with patient certain preventive protocols, quality metrics, and best practice recommendations. A written personalized care plan for preventive services as well as general preventive health recommendations were provided to patient.     Daphane Shepherd, LPN   [phone]   Nurse Notes: Due Tdap Everlean Alstrom Vaccine

## 2022-09-09 NOTE — Patient Instructions (Signed)
Daniel Frank , Thank you for taking time to come for your Medicare Wellness Visit. I appreciate your ongoing commitment to your health goals. Please review the following plan we discussed and let me know if I can assist you in the future.   These are the goals we discussed:  Goals      Exercise 3x per week (30 min per time)        This is a list of the screening recommended for you and due dates:  Health Maintenance  Topic Date Due   COVID-19 Vaccine (1) Never done   Eye exam for diabetics  Never done   Zoster (Shingles) Vaccine (1 of 2) Never done   Complete foot exam   01/03/2014   DTaP/Tdap/Td vaccine (2 - Td or Tdap) 08/06/2016   Pneumonia Vaccine (1 of 1 - PCV) 05/05/2023*   Hepatitis C Screening: USPSTF Recommendation to screen - Ages 18-79 yo.  05/05/2023*   Yearly kidney health urinalysis for diabetes  05/05/2027*   Colon Cancer Screening  09/21/2022   Hemoglobin A1C  11/02/2022   Flu Shot  01/07/2023   Yearly kidney function blood test for diabetes  08/31/2023   Medicare Annual Wellness Visit  09/09/2023   HPV Vaccine  Aged Out  *Topic was postponed. The date shown is not the original due date.    Advanced directives: Advance directive discussed with you today. I have provided a copy for you to complete at home and have notarized. Once this is complete please bring a copy in to our office so we can scan it into your chart.   Conditions/risks identified: Aim for 30 minutes of exercise or brisk walking, 6-8 glasses of water, and 5 servings of fruits and vegetables each day.   Next appointment: Follow up in one year for your annual wellness visit.  Preventive Care 26 Years and Older, Male  Preventive care refers to lifestyle choices and visits with your health care provider that can promote health and wellness. What does preventive care include? A yearly physical exam. This is also called an annual well check. Dental exams once or twice a year. Routine eye exams. Ask your  health care provider how often you should have your eyes checked. Personal lifestyle choices, including: Daily care of your teeth and gums. Regular physical activity. Eating a healthy diet. Avoiding tobacco and drug use. Limiting alcohol use. Practicing safe sex. Taking low doses of aspirin every day. Taking vitamin and mineral supplements as recommended by your health care provider. What happens during an annual well check? The services and screenings done by your health care provider during your annual well check will depend on your age, overall health, lifestyle risk factors, and family history of disease. Counseling  Your health care provider may ask you questions about your: Alcohol use. Tobacco use. Drug use. Emotional well-being. Home and relationship well-being. Sexual activity. Eating habits. History of falls. Memory and ability to understand (cognition). Work and work Statistician. Screening  You may have the following tests or measurements: Height, weight, and BMI. Blood pressure. Lipid and cholesterol levels. These may be checked every 5 years, or more frequently if you are over 67 years old. Skin check. Lung cancer screening. You may have this screening every year starting at age 46 if you have a 30-pack-year history of smoking and currently smoke or have quit within the past 15 years. Fecal occult blood test (FOBT) of the stool. You may have this test every year starting at age  50. Flexible sigmoidoscopy or colonoscopy. You may have a sigmoidoscopy every 5 years or a colonoscopy every 10 years starting at age 59. Prostate cancer screening. Recommendations will vary depending on your family history and other risks. Hepatitis C blood test. Hepatitis B blood test. Sexually transmitted disease (STD) testing. Diabetes screening. This is done by checking your blood sugar (glucose) after you have not eaten for a while (fasting). You may have this done every 1-3  years. Abdominal aortic aneurysm (AAA) screening. You may need this if you are a current or former smoker. Osteoporosis. You may be screened starting at age 110 if you are at high risk. Talk with your health care provider about your test results, treatment options, and if necessary, the need for more tests. Vaccines  Your health care provider may recommend certain vaccines, such as: Influenza vaccine. This is recommended every year. Tetanus, diphtheria, and acellular pertussis (Tdap, Td) vaccine. You may need a Td booster every 10 years. Zoster vaccine. You may need this after age 9. Pneumococcal 13-valent conjugate (PCV13) vaccine. One dose is recommended after age 79. Pneumococcal polysaccharide (PPSV23) vaccine. One dose is recommended after age 73. Talk to your health care provider about which screenings and vaccines you need and how often you need them. This information is not intended to replace advice given to you by your health care provider. Make sure you discuss any questions you have with your health care provider. Document Released: 06/21/2015 Document Revised: 02/12/2016 Document Reviewed: 03/26/2015 Elsevier Interactive Patient Education  2017 [place] Prevention in the Home Falls can cause injuries. They can happen to people of all ages. There are many things you can do to make your home safe and to help prevent falls. What can I do on the outside of my home? Regularly fix the edges of walkways and driveways and fix any cracks. Remove anything that might make you trip as you walk through a door, such as a raised step or threshold. Trim any bushes or trees on the path to your home. Use bright outdoor lighting. Clear any walking paths of anything that might make someone trip, such as rocks or tools. Regularly check to see if handrails are loose or broken. Make sure that both sides of any steps have handrails. Any raised decks and porches should have guardrails on the  edges. Have any leaves, snow, or ice cleared regularly. Use sand or salt on walking paths during winter. Clean up any spills in your garage right away. This includes oil or grease spills. What can I do in the bathroom? Use night lights. Install grab bars by the toilet and in the tub and shower. Do not use towel bars as grab bars. Use non-skid mats or decals in the tub or shower. If you need to sit down in the shower, use a plastic, non-slip stool. Keep the floor dry. Clean up any water that spills on the floor as soon as it happens. Remove soap buildup in the tub or shower regularly. Attach bath mats securely with double-sided non-slip rug tape. Do not have throw rugs and other things on the floor that can make you trip. What can I do in the bedroom? Use night lights. Make sure that you have a light by your bed that is easy to reach. Do not use any sheets or blankets that are too big for your bed. They should not hang down onto the floor. Have a firm chair that has side arms. You can use  this for support while you get dressed. Do not have throw rugs and other things on the floor that can make you trip. What can I do in the kitchen? Clean up any spills right away. Avoid walking on wet floors. Keep items that you use a lot in easy-to-reach places. If you need to reach something above you, use a strong step stool that has a grab bar. Keep electrical cords out of the way. Do not use floor polish or wax that makes floors slippery. If you must use wax, use non-skid floor wax. Do not have throw rugs and other things on the floor that can make you trip. What can I do with my stairs? Do not leave any items on the stairs. Make sure that there are handrails on both sides of the stairs and use them. Fix handrails that are broken or loose. Make sure that handrails are as long as the stairways. Check any carpeting to make sure that it is firmly attached to the stairs. Fix any carpet that is loose or  worn. Avoid having throw rugs at the top or bottom of the stairs. If you do have throw rugs, attach them to the floor with carpet tape. Make sure that you have a light switch at the top of the stairs and the bottom of the stairs. If you do not have them, ask someone to add them for you. What else can I do to help prevent falls? Wear shoes that: Do not have high heels. Have rubber bottoms. Are comfortable and fit you well. Are closed at the toe. Do not wear sandals. If you use a stepladder: Make sure that it is fully opened. Do not climb a closed stepladder. Make sure that both sides of the stepladder are locked into place. Ask someone to hold it for you, if possible. Clearly mark and make sure that you can see: Any grab bars or handrails. First and last steps. Where the edge of each step is. Use tools that help you move around (mobility aids) if they are needed. These include: Canes. Walkers. Scooters. Crutches. Turn on the lights when you go into a dark area. Replace any light bulbs as soon as they burn out. Set up your furniture so you have a clear path. Avoid moving your furniture around. If any of your floors are uneven, fix them. If there are any pets around you, be aware of where they are. Review your medicines with your doctor. Some medicines can make you feel dizzy. This can increase your chance of falling. Ask your doctor what other things that you can do to help prevent falls. This information is not intended to replace advice given to you by your health care provider. Make sure you discuss any questions you have with your health care provider. Document Released: 03/21/2009 Document Revised: 10/31/2015 Document Reviewed: 06/29/2014 Elsevier Interactive Patient Education  2017 [employer].

## 2022-10-11 DIAGNOSIS — R002 Palpitations: Secondary | ICD-10-CM | POA: Insufficient documentation

## 2022-10-11 DIAGNOSIS — R072 Precordial pain: Secondary | ICD-10-CM | POA: Insufficient documentation

## 2022-10-11 NOTE — Progress Notes (Signed)
  Cardiology Office Note:   Date:  10/14/2022  ID:  LEBERT VUKOVICH, DOB 1950/06/18, MRN [id number]  History of Present Illness:   Daniel Frank is a 72 y.o. male who presents for evaluation of palpitations and hypertension.  At the last visit I increased his Benicar because his blood pressure is not controlled.  It was still a little elevated and apparently had HCTZ added but then he got sick with COVID and may be dehydrated and his blood pressure dropped.  He stopped that on his own.  He has been back on the 40 mg Benicar and he is showing me blood pressure readings that are normal.  He denies any palpitations.  He is not having any chest discomfort that he described previously.  He denies any shortness of breath, PND or orthopnea.  He said no palpitations, presyncope or syncope.  He has some very mild lower extremity swelling.  He does chores around the yard.  He has stress caring for an elderly mom.  ROS: As stated in the HPI and negative for all other systems.  Studies Reviewed:    EKG:  NA    Risk Assessment/Calculations:           Physical Exam:   VS:  BP (!) 144/80   Pulse 76   Ht 5' 10.5" (1.791 m)   Wt 225 lb (102.1 kg)   BMI 31.83 kg/m    Wt Readings from Last 3 Encounters:  10/14/22 225 lb (102.1 kg)  09/09/22 215 lb (97.5 kg)  08/31/22 219 lb 12.8 oz (99.7 kg)     GEN: Well nourished, well developed in no acute distress NECK: No JVD; No carotid bruits CARDIAC: RRR, no murmurs, rubs, gallops RESPIRATORY:  Clear to auscultation without rales, wheezing or rhonchi  ABDOMEN: Soft, non-tender, non-distended EXTREMITIES:  No edema; No deformity   ASSESSMENT AND PLAN:   Palpitation: He is not describing these.  No further workup.   HTN: Blood pressure is controlled and I reviewed his blood pressure diary.  Continue the meds as listed.  Chest pain: He is not describing this.  No change in therapy.  No further imaging.   Abnormal EKG:   He has a mild incomplete right  bundle branch block.  No further workup is suggested.        Signed, Rollene Rotunda, MD

## 2022-10-14 ENCOUNTER — Ambulatory Visit: Payer: Medicare Other | Admitting: Cardiology

## 2022-10-14 ENCOUNTER — Encounter: Payer: Self-pay | Admitting: Cardiology

## 2022-10-14 VITALS — BP 144/80 | HR 76 | Ht 70.5 in | Wt 225.0 lb

## 2022-10-14 DIAGNOSIS — R002 Palpitations: Secondary | ICD-10-CM

## 2022-10-14 DIAGNOSIS — I10 Essential (primary) hypertension: Secondary | ICD-10-CM | POA: Diagnosis not present

## 2022-10-14 DIAGNOSIS — R072 Precordial pain: Secondary | ICD-10-CM | POA: Diagnosis not present

## 2022-10-14 NOTE — Patient Instructions (Signed)
Medication Instructions:  The current medical regimen is effective;  continue present plan and medications.  *If you need a refill on your cardiac medications before your next appointment, please call your pharmacy*  Follow-Up: At [facility], you and your health needs are our priority.  As part of our continuing mission to provide you with exceptional heart care, we have created designated Provider Care Teams.  These Care Teams include your primary Cardiologist (physician) and Advanced Practice Providers (APPs -  Physician Assistants and Nurse Practitioners) who all work together to provide you with the care you need, when you need it.  We recommend signing up for the patient portal called "MyChart".  Sign up information is provided on this After Visit Summary.  MyChart is used to connect with patients for Virtual Visits (Telemedicine).  Patients are able to view lab/test results, encounter notes, upcoming appointments, etc.  Non-urgent messages can be sent to your provider as well.   To learn more about what you can do with MyChart, go to [email or web address].    Your next appointment:   Follow up as needed with Dr Hochrein  

## 2022-12-16 ENCOUNTER — Ambulatory Visit: Payer: Medicare Other | Admitting: Family Medicine

## 2023-01-15 ENCOUNTER — Encounter: Payer: Self-pay | Admitting: *Deleted

## 2023-02-25 ENCOUNTER — Ambulatory Visit: Payer: Medicare Other | Admitting: Family Medicine

## 2023-02-25 ENCOUNTER — Encounter: Payer: Self-pay | Admitting: Family Medicine

## 2023-02-25 VITALS — BP 144/74 | HR 74 | Temp 97.9°F | Ht 70.5 in | Wt 221.6 lb

## 2023-02-25 DIAGNOSIS — E785 Hyperlipidemia, unspecified: Secondary | ICD-10-CM

## 2023-02-25 DIAGNOSIS — E782 Mixed hyperlipidemia: Secondary | ICD-10-CM | POA: Diagnosis not present

## 2023-02-25 DIAGNOSIS — I10 Essential (primary) hypertension: Secondary | ICD-10-CM

## 2023-02-25 DIAGNOSIS — R7303 Prediabetes: Secondary | ICD-10-CM

## 2023-02-25 LAB — BAYER DCA HB A1C WAIVED: HB A1C (BAYER DCA - WAIVED): 5.7 % — ABNORMAL HIGH (ref 4.8–5.6)

## 2023-02-25 NOTE — Progress Notes (Signed)
Subjective:  Patient ID: Daniel Frank, male    DOB: February 05, 1951  Age: 72 y.o. MRN: [id number]  CC: Medical Management of Chronic Issues   HPI Daniel Frank presents for Follow-up of diabetes. Patient denies symptoms such as polyuria, polydipsia, excessive hunger, nausea No significant hypoglycemic spells noted. Medications reviewed. Pt reports taking them regularly without complication/adverse reaction being reported today.  AS I concluded the visit I asked if Daniel Frank had questions about today's visit. Daniel Frank stated Daniel Frank had one about his mother. I replied that I was not at liberty to discuss one patient with another. Daniel Frank said, "She's dead." I expressed condolences and offered to help him as best I could.  Pt. Angry that hospice let his mother die 7 mos ago. When I explained  that Hospice role was to comfort during a life ending experience, Daniel Frank stated they were negligent and should be shut down. She got pneumonia and died at home. I suggested that their role is to not prolong the inevitable, but to allow it with comfort and dignity. This made him lash out further, becoming angry with me for not agreeing with him. Daniel Frank said Daniel Frank didn't like me and would find a new doctor.  History Daniel Frank has a past medical history of Arthritis, Degenerative disc disease, Depressive disorder, not elsewhere classified, Eczema, Hyperactivity of bladder, Organic impotence, Other and unspecified hyperlipidemia, Rosacea, Sleep apnea, Spastic dysuria, and Unspecified essential hypertension.   Daniel Frank has a past surgical history that includes Colonoscopy (09/20/2017).   His family history includes Cancer in his sister; Chronic bronchitis in his mother; Heart failure in his mother; Lung disease in his father; Obesity in his brother and sister; Uterine cancer in his sister.Daniel Frank reports that Daniel Frank quit smoking about 16 years ago. His smoking use included cigarettes. Daniel Frank has never used smokeless tobacco. Daniel Frank reports that Daniel Frank does not currently use  alcohol after a past usage of about 11.0 standard drinks of alcohol per week. Daniel Frank reports that Daniel Frank does not use drugs.  Current Outpatient Medications on File Prior to Visit  Medication Sig Dispense Refill   augmented betamethasone dipropionate (DIPROLENE-AF) 0.05 % ointment Apply topically daily as needed. 50 g 10   Multiple Vitamin (MULTIVITAMIN) tablet Take 1 tablet by mouth daily.     Current Facility-Administered Medications on File Prior to Visit  Medication Dose Route Frequency Provider Last Rate Last Admin   0.9 %  sodium chloride infusion  500 mL Intravenous Once Meryl Dare, MD        ROS Review of Systems  Constitutional: Negative.   HENT: Negative.    Eyes:  Negative for visual disturbance.  Respiratory:  Negative for cough and shortness of breath.   Cardiovascular:  Negative for chest pain and leg swelling.  Gastrointestinal:  Negative for abdominal pain, diarrhea, nausea and vomiting.  Genitourinary:  Negative for difficulty urinating.  Musculoskeletal:  Negative for arthralgias and myalgias.  Skin:  Negative for rash.  Neurological:  Negative for headaches.  Psychiatric/Behavioral:  Negative for sleep disturbance.     Objective:  BP (!) 144/74   Pulse 74   Temp 97.9 F (36.6 C)   Ht 5' 10.5" (1.791 m)   Wt 221 lb 9.6 oz (100.5 kg)   SpO2 95%   BMI 31.35 kg/m   BP Readings from Last 3 Encounters:  02/25/23 (!) 144/74  10/14/22 (!) 144/80  08/31/22 119/70    Wt Readings from Last 3 Encounters:  02/25/23 221 lb 9.6 oz (100.5  kg)  10/14/22 225 lb (102.1 kg)  09/09/22 215 lb (97.5 kg)     Physical Exam Vitals reviewed.  Constitutional:      Appearance: Daniel Frank is well-developed.  HENT:     Head: Normocephalic and atraumatic.     Right Ear: External ear normal.     Left Ear: External ear normal.     Mouth/Throat:     Pharynx: No oropharyngeal exudate or posterior oropharyngeal erythema.  Eyes:     Pupils: Pupils are equal, round, and reactive to  light.  Cardiovascular:     Rate and Rhythm: Normal rate and regular rhythm.     Heart sounds: No murmur heard. Pulmonary:     Effort: No respiratory distress.     Breath sounds: Normal breath sounds.  Musculoskeletal:     Cervical back: Normal range of motion and neck supple.  Neurological:     Mental Status: Daniel Frank is alert and oriented to person, place, and time.  Psychiatric:        Mood and Affect: Affect is angry.        Behavior: Behavior is agitated and aggressive.        Thought Content: Thought content is paranoid.        Cognition and Memory: Cognition normal.        Judgment: Judgment is impulsive and inappropriate.       Assessment & Plan:   Daniel Frank was seen today for medical management of chronic issues.  Diagnoses and all orders for this visit:  Essential hypertension -     CBC with Differential/Platelet -     CMP14+EGFR  Mixed hyperlipidemia -     Lipid panel  Prediabetes -     Bayer DCA Hb A1c Waived  HLD (hyperlipidemia) -     atorvastatin (LIPITOR) 40 MG tablet; Take 1 tablet (40 mg total) by mouth daily.  Other orders -     olmesartan (BENICAR) 40 MG tablet; Take 1 tablet (40 mg total) by mouth daily. -     tamsulosin (FLOMAX) 0.4 MG CAPS capsule; Take 2 capsules (0.8 mg total) by mouth daily after supper.      I am having Daniel Frank maintain his multivitamin, augmented betamethasone dipropionate, atorvastatin, olmesartan, and tamsulosin. We will continue to administer sodium chloride.  Meds ordered this encounter  Medications   atorvastatin (LIPITOR) 40 MG tablet    Sig: Take 1 tablet (40 mg total) by mouth daily.    Dispense:  30 tablet    Refill:  0   olmesartan (BENICAR) 40 MG tablet    Sig: Take 1 tablet (40 mg total) by mouth daily.    Dispense:  30 tablet    Refill:  0   tamsulosin (FLOMAX) 0.4 MG CAPS capsule    Sig: Take 2 capsules (0.8 mg total) by mouth daily after supper.    Dispense:  60 capsule    Refill:  0   Due to  patient's multiple inflammatory & personal statements, accompanied by aggressive behavior with me and my office nurse, I have asked him to not return. A letter was later sent explaining that Daniel Frank has 30 days during which needed care would not be denied.  Follow-up: prn for 30 days.  Mechele Claude, M.D.

## 2023-02-26 LAB — CMP14+EGFR
ALT: 18 IU/L (ref 0–44)
AST: 18 IU/L (ref 0–40)
Albumin: 4.3 g/dL (ref 3.9–4.9)
Alkaline Phosphatase: 83 IU/L (ref 44–121)
BUN/Creatinine Ratio: 16 (ref 10–24)
BUN: 16 mg/dL (ref 8–27)
Bilirubin Total: 0.5 mg/dL (ref 0.0–1.2)
CO2: 26 mmol/L (ref 20–29)
Calcium: 9.2 mg/dL (ref 8.6–10.2)
Chloride: 104 mmol/L (ref 96–106)
Creatinine, Ser: 1 mg/dL (ref 0.76–1.27)
Globulin, Total: 1.8 g/dL (ref 1.5–4.5)
Glucose: 110 mg/dL — ABNORMAL HIGH (ref 70–99)
Potassium: 4.2 mmol/L (ref 3.5–5.2)
Sodium: 145 mmol/L — ABNORMAL HIGH (ref 134–144)
Total Protein: 6.1 g/dL (ref 6.0–8.5)
eGFR: 81 mL/min/{1.73_m2} (ref >59)

## 2023-02-26 LAB — CBC WITH DIFFERENTIAL/PLATELET
Basophils Absolute: 0.1 10*3/uL (ref 0.0–0.2)
Basos: 1 %
EOS (ABSOLUTE): 0.3 10*3/uL (ref 0.0–0.4)
Eos: 5 %
Hematocrit: 42.7 % (ref 37.5–51.0)
Hemoglobin: 14.7 g/dL (ref 13.0–17.7)
Immature Grans (Abs): 0 10*3/uL (ref 0.0–0.1)
Immature Granulocytes: 0 %
Lymphocytes Absolute: 1.9 10*3/uL (ref 0.7–3.1)
Lymphs: 31 %
MCH: 34 pg — ABNORMAL HIGH (ref 26.6–33.0)
MCHC: 34.4 g/dL (ref 31.5–35.7)
MCV: 99 fL — ABNORMAL HIGH (ref 79–97)
Monocytes Absolute: 0.5 10*3/uL (ref 0.1–0.9)
Monocytes: 8 %
Neutrophils Absolute: 3.3 10*3/uL (ref 1.4–7.0)
Neutrophils: 55 %
Platelets: 159 10*3/uL (ref 150–450)
RBC: 4.32 x10E6/uL (ref 4.14–5.80)
RDW: 12.1 % (ref 11.6–15.4)
WBC: 6.1 10*3/uL (ref 3.4–10.8)

## 2023-02-26 LAB — LIPID PANEL
Chol/HDL Ratio: 2.7 ratio (ref 0.0–5.0)
Cholesterol, Total: 127 mg/dL (ref 100–199)
HDL: 47 mg/dL (ref >39)
LDL Chol Calc (NIH): 58 mg/dL (ref 0–99)
Triglycerides: 126 mg/dL (ref 0–149)
VLDL Cholesterol Cal: 22 mg/dL (ref 5–40)

## 2023-02-28 MED ORDER — TAMSULOSIN HCL 0.4 MG PO CAPS: 0.8000 mg | ORAL_CAPSULE | Freq: Every day | ORAL | 0 refills | Status: AC

## 2023-02-28 MED ORDER — OLMESARTAN MEDOXOMIL 40 MG PO TABS: 40.0000 mg | ORAL_TABLET | Freq: Every day | ORAL | 0 refills | Status: AC

## 2023-02-28 MED ORDER — ATORVASTATIN CALCIUM 40 MG PO TABS
40.0000 mg | ORAL_TABLET | Freq: Every day | ORAL | 0 refills | Status: AC
Start: 2023-02-28 — End: ?

## 2023-03-02 NOTE — Progress Notes (Signed)
Hello Anatole,  Your lab result is normal and/or stable.Some minor variations that are not significant are commonly marked abnormal, but do not represent any medical problem for you.  Best regards, Mechele Claude, M.D.

## 2023-03-03 ENCOUNTER — Ambulatory Visit: Payer: Medicare Other | Admitting: Family Medicine

## 2023-06-08 ENCOUNTER — Other Ambulatory Visit: Payer: Self-pay | Admitting: Family Medicine

## 2023-06-21 ENCOUNTER — Emergency Department (HOSPITAL_COMMUNITY): Payer: Medicare Other

## 2023-06-21 ENCOUNTER — Emergency Department (HOSPITAL_COMMUNITY)
Admission: EM | Admit: 2023-06-21 | Discharge: 2023-06-21 | Disposition: A | Discharge status: Home / Self Care | Payer: Medicare Other | Attending: Emergency Medicine | Admitting: Emergency Medicine

## 2023-06-21 ENCOUNTER — Other Ambulatory Visit: Payer: Self-pay

## 2023-06-21 ENCOUNTER — Encounter (HOSPITAL_COMMUNITY): Payer: Self-pay

## 2023-06-21 DIAGNOSIS — R0789 Other chest pain: Secondary | ICD-10-CM | POA: Insufficient documentation

## 2023-06-21 LAB — CBC WITH DIFFERENTIAL/PLATELET
Abs Immature Granulocytes: 0.03 10*3/uL (ref 0.00–0.07)
Basophils Absolute: 0.1 10*3/uL (ref 0.0–0.1)
Basophils Relative: 1 %
Eosinophils Absolute: 0.2 10*3/uL (ref 0.0–0.5)
Eosinophils Relative: 3 %
HCT: 43.8 % (ref 39.0–52.0)
Hemoglobin: 15.1 g/dL (ref 13.0–17.0)
Immature Granulocytes: 0 %
Lymphocytes Relative: 17 %
Lymphs Abs: 1.5 10*3/uL (ref 0.7–4.0)
MCH: 33.5 pg (ref 26.0–34.0)
MCHC: 34.5 g/dL (ref 30.0–36.0)
MCV: 97.1 fL (ref 80.0–100.0)
Monocytes Absolute: 0.6 10*3/uL (ref 0.1–1.0)
Monocytes Relative: 7 %
Neutro Abs: 6.5 10*3/uL (ref 1.7–7.7)
Neutrophils Relative %: 72 %
Platelets: 152 10*3/uL (ref 150–400)
RBC: 4.51 MIL/uL (ref 4.22–5.81)
RDW: 12 % (ref 11.5–15.5)
WBC: 8.9 10*3/uL (ref 4.0–10.5)
nRBC: 0 % (ref 0.0–0.2)

## 2023-06-21 LAB — COMPREHENSIVE METABOLIC PANEL
ALT: 28 U/L (ref 0–44)
AST: 23 U/L (ref 15–41)
Albumin: 4.1 g/dL (ref 3.5–5.0)
Alkaline Phosphatase: 66 U/L (ref 38–126)
Anion gap: 7 (ref 5–15)
BUN: 15 mg/dL (ref 8–23)
CO2: 28 mmol/L (ref 22–32)
Calcium: 8.9 mg/dL (ref 8.9–10.3)
Chloride: 102 mmol/L (ref 98–111)
Creatinine, Ser: 1.02 mg/dL (ref 0.61–1.24)
GFR, Estimated: >60 mL/min (ref >60)
Glucose, Bld: 120 mg/dL — ABNORMAL HIGH (ref 70–99)
Potassium: 4.3 mmol/L (ref 3.5–5.1)
Sodium: 137 mmol/L (ref 135–145)
Total Bilirubin: 0.9 mg/dL (ref 0.0–1.2)
Total Protein: 6.9 g/dL (ref 6.5–8.1)

## 2023-06-21 LAB — TROPONIN I (HIGH SENSITIVITY)
Troponin I (High Sensitivity): 5 ng/L (ref <18)
Troponin I (High Sensitivity): 4 ng/L (ref <18)

## 2023-06-21 NOTE — Discharge Instructions (Signed)
You were seen for your chest pain in the emergency department.   At home, please take tylenol for your pain.    Follow-up with your primary doctor in 2-3 days regarding your visit.  Cardiology will be calling you regarding an appointment within the next 72 hours.  You may contact them if you do not hear from them in that time using the information in this packet.  Return immediately to the emergency department if you experience any of the following: Worsening pain, difficulty breathing, unexplained vomiting or sweating, or any other concerning symptoms.    Thank you for visiting our Emergency Department. It was a pleasure taking care of you today.

## 2023-06-21 NOTE — ED Provider Notes (Signed)
 [place] EMERGENCY DEPARTMENT AT [facility] Provider Note   CSN: [id number] Arrival date & time: 06/21/23  1903     History  Chief Complaint  Patient presents with   Chest Pain    Daniel Frank is a 73 y.o. male.  73 year old male history of hypertension who presents emergency department chest discomfort.  Patient reports that for the past month he has been having intermittent chest discomfort that is substernal.  Today he was finishing a 1 mile walk when he started feeling it.  Thinks it may have radiated up to his jaw.  No diaphoresis or vomiting.  Went to urgent care initially who referred him to the emergency department.  Says that currently he is not having any pain.  When it comes on he says it is very mild in severity.  No history of heart attack.  Took 4 aspirin prior to arrival.       Home Medications Prior to Admission medications   Medication Sig Start Date End Date Taking? Authorizing Provider  atorvastatin  (LIPITOR) 40 MG tablet Take 1 tablet (40 mg total) by mouth daily. 02/28/23   Zollie Lowers, MD  augmented betamethasone  dipropionate (DIPROLENE -AF) 0.05 % ointment Apply topically daily as needed. 05/04/22   Zollie Lowers, MD  Multiple Vitamin (MULTIVITAMIN) tablet Take 1 tablet by mouth daily.    [provider]  olmesartan  (BENICAR ) 40 MG tablet Take 1 tablet (40 mg total) by mouth daily. 02/28/23   Zollie Lowers, MD  tamsulosin  (FLOMAX ) 0.4 MG CAPS capsule Take 2 capsules (0.8 mg total) by mouth daily after supper. 02/28/23   Zollie Lowers, MD      Allergies    Patient has no known allergies.    Review of Systems   Review of Systems  Physical Exam Updated Vital Signs BP (!) 144/84   Pulse 62   Temp 98.5 F (36.9 C) (Oral)   Resp 18   Ht 5' 10 (1.778 m)   Wt 101.2 kg   SpO2 94%   BMI 32.00 kg/m  Physical Exam Vitals and nursing note reviewed.  Constitutional:      General: He is not in acute distress.    Appearance: He  is well-developed.  HENT:     Head: Normocephalic and atraumatic.     Right Ear: External ear normal.     Left Ear: External ear normal.     Nose: Nose normal.  Eyes:     Extraocular Movements: Extraocular movements intact.     Conjunctiva/sclera: Conjunctivae normal.     Pupils: Pupils are equal, round, and reactive to light.  Cardiovascular:     Rate and Rhythm: Normal rate and regular rhythm.     Heart sounds: Normal heart sounds.  Pulmonary:     Effort: Pulmonary effort is normal. No respiratory distress.     Breath sounds: Normal breath sounds.  Musculoskeletal:     Cervical back: Normal range of motion and neck supple.     Right lower leg: No edema.     Left lower leg: No edema.  Skin:    General: Skin is warm and dry.  Neurological:     Mental Status: He is alert. Mental status is at baseline.  Psychiatric:        Mood and Affect: Mood normal.        Behavior: Behavior normal.     ED Results / Procedures / Treatments   Labs (all labs ordered are listed, but only abnormal results  are displayed) Labs Reviewed  COMPREHENSIVE METABOLIC PANEL - Abnormal; Notable for the following components:      Result Value   Glucose, Bld 120 (*)    All other components within normal limits  CBC WITH DIFFERENTIAL/PLATELET  TROPONIN I (HIGH SENSITIVITY)  TROPONIN I (HIGH SENSITIVITY)    EKG EKG Interpretation Date/Time:  Monday June 21 2023 19:19:49 EST Ventricular Rate:  74 PR Interval:  188 QRS Duration:  98 QT Interval:  404 QTC Calculation: 448 R Axis:   0  Text Interpretation: Normal sinus rhythm Incomplete right bundle branch block Borderline ECG Confirmed by Yolande Charleston [phone]) on 06/21/2023 8:25:15 PM  Radiology DG Chest 2 View Result Date: 06/21/2023 CLINICAL DATA:  Pain. Intermittent chest pain for 3 days. Worse with walking today. EXAM: CHEST - 2 VIEW COMPARISON:  09/04/2020 FINDINGS: Normal heart size and pulmonary vascularity. No focal airspace disease  or consolidation in the lungs. No blunting of costophrenic angles. No pneumothorax. Mediastinal contours appear intact. Calcification of the aorta. Degenerative changes in the spine. IMPRESSION: No active cardiopulmonary disease. Electronically Signed   By: Elsie Gravely M.D.   On: 06/21/2023 20:49    Procedures Procedures    Medications Ordered in ED Medications - No data to display  ED Course/ Medical Decision Making/ A&P                                 Medical Decision Making  [name] is a 73 y.o. male with comorbidities that complicate the patient evaluation including hypertension who presents emergency department chest discomfort  Initial Ddx:  MI, PE, pneumonia, dissection, pericarditis, costochondritis, reflux  MDM:  With the patient's chest discomfort will obtain EKG and troponins to evaluate for MI.  Also considering pulmonary embolism but no significant dyspnea on exertion, cough, or other risk factors for PE so feel this is less likely.  Considered dissection but with their symmetric pulses, history, and description of the pain feel it is less likely.  If chest x-ray reveals widened mediastinum or any other concerning findings will consider CTA.  Also considered pericarditis but description is unlikely and they do not have risk factors for this diagnosis.  Chest pain not reproducible so feel it costochondritis less likely.  No infectious symptoms to suggest pneumonia at this time that would be causing pleuritic chest pain.  Plan:  Labs Troponin EKG Chest x-ray  ED Summary/Re-evaluation:  EKG with incomplete right bundle branch block without any other signs of ischemia.  His troponins were 5 and 4 respectively.  Chest x-ray without acute abnormalities.  Did talk to the patient again and upon reevaluation was saying that he has been under significant stress with after the death of his mother.  Unclear if this could be playing a role in his symptoms.  Will him  follow-up with cardiology as an outpatient.  Return precautions discussed prior to discharge.  This patient presents to the ED for concern of complaints listed in HPI, this involves an extensive number of treatment options, and is a complaint that carries with it a high risk of complications and morbidity. Disposition including potential need for admission considered.   Dispo: DC Home. Return precautions discussed including, but not limited to, those listed in the AVS. Allowed pt time to ask questions which were answered fully prior to dc.  Records reviewed Outpatient Clinic Notes The following labs were independently interpreted: Serial Troponins and show no  acute abnormality I independently reviewed the following imaging with scope of interpretation limited to determining acute life threatening conditions related to emergency care: Chest x-ray and agree with the radiologist interpretation with the following exceptions: none I personally reviewed and interpreted cardiac monitoring: normal sinus rhythm  I personally reviewed and interpreted the pt's EKG: see above for interpretation  I have reviewed the patients home medications and made adjustments as needed Social Determinants of health:  Elderly   Final Clinical Impression(s) / ED Diagnoses Final diagnoses:  Other chest pain    Rx / DC Orders ED Discharge Orders          Ordered    Ambulatory referral to Cardiology        06/21/23 2254              Yolande Lamar BROCKS, MD 06/23/23 0001

## 2023-06-21 NOTE — ED Triage Notes (Signed)
 Pt with intermittent chest pain x3 days. Was walking today and it got worse. Pt took 4 aspirin PTA. Pain 3/10. Pt has had recently stressors.

## 2023-06-21 NOTE — ED Provider Triage Note (Signed)
 Emergency Medicine Provider Triage Evaluation Note  Daniel Frank , a 73 y.o. male  was evaluated in triage.  Pt complains of chest pain.  Review of Systems  Positive: CP Negative: SOB  Physical Exam  There were no vitals taken for this visit. Gen:   Awake, no distress   Resp:  Normal effort  MSK:   Moves extremities without difficulty  Other:    Medical Decision Making  Medically screening exam initiated at 7:07 PM.  Appropriate orders placed.  Daniel Frank was informed that the remainder of the evaluation will be completed by another provider, this initial triage assessment does not replace that evaluation, and the importance of remaining in the ED until their evaluation is complete.  Chest pain on and off, today worse after exertion. 3 out of 10 pain now after aspirin.   Odell Balls, PA-C 06/21/23 1909

## 2023-06-21 NOTE — ED Notes (Signed)
 Reviewed D/C information with the patient, pt verbalized understanding. No additional concerns at this time.

## 2023-08-29 NOTE — Progress Notes (Unsigned)
  Cardiology Office Note:   Date:  08/30/2023  ID:  IFEANYI MICKELSON, DOB 24-Jan-1951, MRN [id number] PCP: Beam, Chales Salmon, MD  [facility] Providers Cardiologist:  Rollene Rotunda, MD {  History of Present Illness:   Daniel Frank is a 73 y.o. male who I saw previously for for evaluation of palpitations and hypertension.   There were no significant findings.  He was in the ED in Jan with chest pain.  I reviewed these records for this visit.   There was no evidence of ischemia on EKG or elevated enzymes.    He said that his discomfort happened after he had walked a an hour and 15 minutes.  It was a mid chest discomfort.  It was similar to previous discomfort but a larger area.  It was 4 out of 10 in intensity.  There is no radiation to his jaw or to his arms.  He did not have any associated nausea vomiting or diaphoresis.  He did not have any associated shortness of breath, PND or orthopnea.  It went away spontaneously.  He went to urgent care and then the ER.  Since then he has not had any further discomfort.  He has been somewhat lonely.  His mom who he was caring for died.  He has been treated with an antidepressant.  But the  ROS: As stated in the HPI and negative for all other systems.  Studies Reviewed:    EKG:         Risk Assessment/Calculations:      Physical Exam:   VS:  BP (!) 140/80   Pulse 84   Ht 5\' 11"  (1.803 m)   Wt 222 lb (100.7 kg)   BMI 30.96 kg/m    Wt Readings from Last 3 Encounters:  08/30/23 222 lb (100.7 kg)  06/21/23 223 lb (101.2 kg)  02/25/23 221 lb 9.6 oz (100.5 kg)     GEN: Well nourished, well developed in no acute distress NECK: No JVD; No carotid bruits CARDIAC: RRR, no murmurs, rubs, gallops RESPIRATORY:  Clear to auscultation without rales, wheezing or rhonchi  ABDOMEN: Soft, non-tender, non-distended EXTREMITIES:  No edema; No deformity   ASSESSMENT AND PLAN:    HTN: The blood pressure is at target. No change in medications is  indicated. We will continue with therapeutic lifestyle changes (TLC).    Chest pain: This is somewhat nonanginal greater than anginal. I will bring the patient back for a POET (Plain Old Exercise Test). This will allow me to screen for obstructive coronary disease, risk stratify and very importantly provide a prescription for exercise.   Follow up with me as needed.   Signed, Rollene Rotunda, MD

## 2023-08-30 ENCOUNTER — Encounter: Payer: Self-pay | Admitting: Cardiology

## 2023-08-30 ENCOUNTER — Ambulatory Visit: Payer: Medicare Other | Attending: Cardiology | Admitting: Cardiology

## 2023-08-30 VITALS — BP 140/80 | HR 84 | Ht 71.0 in | Wt 222.0 lb

## 2023-08-30 DIAGNOSIS — R072 Precordial pain: Secondary | ICD-10-CM

## 2023-08-30 NOTE — Patient Instructions (Signed)
 Medication Instructions:  No changes. *If you need a refill on your cardiac medications before your next appointment, please call your pharmacy*   Testing/Procedures: Will schedule be at [address] Your physician has requested that you have an exercise tolerance test.. An exercise tolerance test is a test to check how your heart works during exercise. You will need to walk on a treadmill or for this test. An electrocardiogram (ECG) will record your heartbeat when you are at rest and when you are exercising.  Please also follow instruction sheet, as given.   Do not drink or eat foods with caffeine for 24 hours before the test. (Chocolate, coffee, tea, or energy drinks) If you use an inhaler, bring it with you to the test. Do not smoke for 4 hours before the test. Wear comfortable shoes and clothing.     Follow-Up: At [facility], you and your health needs are our priority.  As part of our continuing mission to provide you with exceptional heart care, we have created designated Provider Care Teams.  These Care Teams include your primary Cardiologist (physician) and Advanced Practice Providers (APPs -  Physician Assistants and Nurse Practitioners) who all work together to provide you with the care you need, when you need it.  We recommend signing up for the patient portal called "MyChart".  Sign up information is provided on this After Visit Summary.  MyChart is used to connect with patients for Virtual Visits (Telemedicine).  Patients are able to view lab/test results, encounter notes, upcoming appointments, etc.  Non-urgent messages can be sent to your provider as well.   To learn more about what you can do with MyChart, go to [email or web address].    Your next appointment:    As needed.   Provider:   Rollene Rotunda, MD     Other Instructions

## 2023-09-07 ENCOUNTER — Ambulatory Visit: Attending: Cardiology

## 2023-09-07 DIAGNOSIS — R072 Precordial pain: Secondary | ICD-10-CM | POA: Diagnosis not present

## 2023-09-07 LAB — EXERCISE TOLERANCE TEST
Angina Index: 0
Duke Treadmill Score: 5
Estimated workload: 6.9
Exercise duration (min): 5 min
Exercise duration (sec): 0 s
MPHR: 150 {beats}/min
Peak HR: 150 {beats}/min
Percent HR: 100 %
RPE: 17
Rest HR: 76 {beats}/min
ST Depression (mm): 0 mm

## 2023-12-02 NOTE — Progress Notes (Signed)
 Medicare AWV  Daniel Frank is a 73 y.o. male who presents for his subsequent annual wellness visit for Medicare.  Clinical documentation was reviewed and is accessible via encounter-level attachments.   reviewed Any physical exam components or additional concerns beyond the scope of the Annual Wellness Visit may be documented in a separate note within this encounter.  Medicare Required Components     Reviewed and updated this visit by provider: Tobacco  Allergies  Meds  Problems  Med Hx  Surg Hx  Fam Hx       Substance Use Disorder Risk Statement: Low Substance Use Disorder / Overdose risk - Risk factors were reviewed and patient is low risk   Patient Care Team: Lamar MARLA Cornet, MD as PCP - General (Family Medicine)  Vitals   Vitals:   12/02/23 0901  BP: 124/72  Patient Position: Sitting  Pulse: 70  Temp: 98.3 F (36.8 C)  TempSrc: Oral  Resp: 17  Height: 5' 10 (1.778 m)  Weight: 224 lb (101.6 kg)  SpO2: 92%  BMI (Calculated): 32.1    Disposition   1. Medicare annual wellness visit, subsequent (Primary) 2. Annual physical exam 3. Nocturia -     PSA; Future 4. Benign prostatic hyperplasia with urinary frequency 5. Essential hypertension 6. Hyperlipidemia, unspecified hyperlipidemia type 7. Recurrent major depressive disorder, in partial remission 8. Dysmetabolic syndrome 9. RLQ abdominal pain Other orders -     magnesium citrate solution; Take 296 mLs by mouth once for 1 dose., Starting Thu 12/02/2023, Normal   Follow up in about 6 months (around 06/02/2024).   Health maintenance issues were discussed with the patient.  A written plan was provided to the patient in the form of patient instructions in the After Visit Summary document.   Subjective   Patient ID:  Daniel Frank is a 73 y.o. (DOB 1950-10-27) male.      Patient presents with  . Medicare Wellness Visit    Pt presents today for medicare wellness visit, concern regarding  RLQ he has been constipated as well.     Dental UTD Eye UTD Tobacco Previous Alcohol. Has started drinking more Mood. Good Sleep no issues. Lives alone. Used to weigh 260 and have OSA Diet. Biscuit in am. One meal day Exercise. Yardwork.  Nocturia 3-8x nightly sometimes  HTN. Compliant with medications. No side effects HLD. Compliant with medications. No side effects Dysmetabolic. Chest pain. Cardiac workup negative Depression. Age 59 acute psychotic episode. Hospitalized. ECT 1981 nervous breakdown 1999 depression 2010 came off abilify 2012 came off antidepressant  Constipation last week. Better with colace. Still intermittent rlq discomfort. No fever. No n/v. Appetite normal      Social History   Tobacco Use  . Smoking status: Former    Current packs/day: 1.00    Average packs/day: 1 pack/day for 17.0 years (17.0 ttl pk-yrs)    Types: Cigarettes    Start date: 12/07/2006    Quit date: 09/08/2006    Passive exposure: Never  . Smokeless tobacco: Never  Vaping Use  . Vaping status: Never Used   History reviewed. No pertinent family history. Past Medical History:  Diagnosis Date  . Hyperlipidemia   . Hypertension    Review of Systems is complete and negative except as noted.  Objective   BP 124/72 (BP Location: Right Upper Arm, Patient Position: Sitting)   Pulse 70   Temp 98.3 F (36.8 C) (Oral)   Resp 17   Ht 5' 10 (1.778 m)  Wt 224 lb (101.6 kg)   SpO2 92%   BMI 32.14 kg/m  General:  Well developed, Well nourished, No distress HEENT: Normocephalic, atraumatic; Pupils equal, round, reactive to light and accommodation; Conjunctiva normal; Bilateral external auditory canals and tympanic membranes normal; Nares normal; Oropharynx moist and clear Neck:  Supple No lymphadenopathy. No bruit Chest No mass CV:  S1S2, Regular rate and rhythm, without murmur, gallops or rubs Lungs:  Clear to auscultation bilaterally with normal effort Abdomen. Obese. Soft.  Normal bowel sounds. Negative murphy. No palpable RLQ pain or rebound Skin:  No focal rashes  Extremities:  No clubbing, cyanosis or edema.   Neuro:  No focal deficits; Cranial nerves II-XII intact     Impression   1. Medicare annual wellness visit, subsequent   2. Annual physical exam   3. Nocturia   4. Benign prostatic hyperplasia with urinary frequency   5. Essential hypertension   6. Hyperlipidemia, unspecified hyperlipidemia type   7. Recurrent major depressive disorder, in partial remission   8. Dysmetabolic syndrome   9. RLQ abdominal pain     Plan  1/ reviewed 2. Reviewed goals of BP, weight/BMI and AHA recommendations diet, exercise and alcohol. Saw cardiology 3/4. Check psa. Continue flomax  5. Stable. Continue present medical management  6. Stable. Continue present medical management  7. Stable. But watch due to hx 8. Encourage low starch diet 9. Suspect constipation. Rx mag citrate. If not normal in 1 week pt to let me know   Patient's Medications       * Accurate as of December 02, 2023  9:47 AM. Reflects encounter med changes as of last refresh          New Prescriptions      Instructions  magnesium citrate solution Started by: Lamar Beam  296 mLs, Oral, Once       Continued Medications      Instructions  atorvastatin  40 mg tablet Commonly known as: LIPITOR  40 mg, Oral, Daily   COMPLETE MULTIVITAMIN/MINERAL PO  Take by mouth.   DULoxetine HCl 60 mg capsule Commonly known as: CYMBALTA  60 mg, Oral, Daily   metroNIDAzole 0.75% gel Commonly known as: METROGEL  Topical, 2 times a day   olmesartan  medoxomil 40 mg tablet Commonly known as: BENICAR   40 mg, Oral, Daily   tamsulosin  0.4 mg Caps Commonly known as: FLOMAX   0.4 mg, Oral, Daily        Orders Placed This Encounter  Procedures  . PSA    Risks, benefits, and alternatives of the medications and treatment plan prescribed today were discussed, and patient expressed  understanding. Plan follow-up as discussed or as needed if any worsening symptoms or change in condition.          *Some images could not be shown.

## 2023-12-14 ENCOUNTER — Ambulatory Visit: Admitting: Internal Medicine

## 2023-12-14 ENCOUNTER — Encounter: Payer: Self-pay | Admitting: Internal Medicine

## 2023-12-14 VITALS — BP 140/88 | HR 78 | Ht 70.0 in | Wt 227.0 lb

## 2023-12-14 DIAGNOSIS — R1031 Right lower quadrant pain: Secondary | ICD-10-CM | POA: Diagnosis not present

## 2023-12-14 DIAGNOSIS — K5900 Constipation, unspecified: Secondary | ICD-10-CM

## 2023-12-14 DIAGNOSIS — Z860100 Personal history of colon polyps, unspecified: Secondary | ICD-10-CM | POA: Diagnosis not present

## 2023-12-14 MED ORDER — NA SULFATE-K SULFATE-MG SULF 17.5-3.13-1.6 GM/177ML PO SOLN
ORAL | 0 refills | Status: DC
Start: 2023-12-14 — End: 2024-01-13

## 2023-12-14 NOTE — Progress Notes (Signed)
 Chief Complaint: Discuss a colonoscopy for polyp surveillance  HPI : 73 year old male with history of depression, arthritis, obesity, OSA, DDD, HTN presents to discuss a colonoscopy for polyp surveillance.   He has had ab pain for the last couple of months. The pain is mainly located in the RLQ. His stools have recently gotten hard more recently. Drinking more liquids seemed to help with his stools. His PCP suggested that he take magnesium citrate, which has helped with the ab pain. He has only taken one dose of magnesium citrate so far. He has one stool every other day on average. Endorses stools that are pellet-like. Denies blood in the stools. Denies fevers. Denies family history of colon cancer. Denies use of blood thinners. Denies N&V, dysphagia, chest burning, or regurgitation. He drinks alcohol on occasion. He does have less stress now that he no longer has to care for his mother, who passed away last year. His last colonoscopy was in 2019 when he was found to have a colon polyp, and he was recommended for 5 year follow up.   Past Medical History:  Diagnosis Date   Arthritis    Degenerative disc disease    Depressive disorder, not elsewhere classified    Eczema    Hyperactivity of bladder    Organic impotence    Other and unspecified hyperlipidemia    Rosacea    Sleep apnea    cpap   Spastic dysuria    Unspecified essential hypertension      Past Surgical History:  Procedure Laterality Date   COLONOSCOPY  09/20/2017   Family History  Problem Relation Age of Onset   Heart failure Mother    Chronic bronchitis Mother    Lung disease Father    Uterine cancer Sister    Cancer Sister        uterine   Obesity Sister    Obesity Brother    Colon cancer Neg Hx    Stomach cancer Neg Hx    Esophageal cancer Neg Hx    Pancreatic cancer Neg Hx    Liver disease Neg Hx    Social History   Tobacco Use   Smoking status: Former    Current packs/day: 0.00    Types: Cigarettes     Quit date: 07/27/2006    Years since quitting: 17.3   Smokeless tobacco: Never  Vaping Use   Vaping status: Never Used  Substance Use Topics   Alcohol use: Yes    Alcohol/week: 11.0 standard drinks of alcohol    Types: 6 Cans of beer, 5 Shots of liquor per week    Comment: occ   Drug use: No   Current Outpatient Medications  Medication Sig Dispense Refill   atorvastatin  (LIPITOR) 40 MG tablet Take 1 tablet (40 mg total) by mouth daily. 30 tablet 0   augmented betamethasone  dipropionate (DIPROLENE -AF) 0.05 % ointment Apply topically daily as needed. 50 g 10   DULoxetine (CYMBALTA) 60 MG capsule Take 60 mg by mouth daily.     Multiple Vitamin (MULTIVITAMIN) tablet Take 1 tablet by mouth daily.     olmesartan  (BENICAR ) 40 MG tablet Take 1 tablet (40 mg total) by mouth daily. 30 tablet 0   tamsulosin  (FLOMAX ) 0.4 MG CAPS capsule Take 2 capsules (0.8 mg total) by mouth daily after supper. 60 capsule 0   Current Facility-Administered Medications  Medication Dose Route Frequency Provider Last Rate Last Admin   0.9 %  sodium chloride  infusion  500 mL  Intravenous Once Aneita Gwendlyn DASEN, MD       No Known Allergies   Review of Systems: All systems reviewed and negative except where noted in HPI.   Physical Exam: BP (!) 140/88   Pulse 78   Ht 5' 10 (1.778 m)   Wt 227 lb (103 kg)   BMI 32.57 kg/m  Constitutional: Pleasant,well-developed, male in no acute distress. HEENT: Normocephalic and atraumatic. Conjunctivae are normal. No scleral icterus. Cardiovascular: Normal rate, regular rhythm.  Pulmonary/chest: Effort normal and breath sounds normal. No wheezing, rales or rhonchi. Abdominal: Soft, nondistended, nontender. Bowel sounds active throughout. There are no masses palpable. No hepatomegaly. Extremities: No edema Neurological: Alert and oriented to person place and time. Skin: Skin is warm and dry. No rashes noted. Psychiatric: Normal mood and affect. Behavior is  normal.  Labs 06/2023: CBC nml. CMP nml.   CT A/P w/contrast 12/29/18: IMPRESSION: No emergent finding or explanation for abdominal pain.  Colonoscopy 09/20/17: - One 6 mm polyp in the transverse colon, removed with a cold snare. Resected and retrieved. - Diverticulosis in the left colon. - Internal hemorrhoids. - The examination was otherwise normal on direct and retroflexion views. Path: Surgical [P], transverse, polyp - TUBULAR ADENOMA(S). - HIGH GRADE DYSPLASIA IS NOT IDENTIFIED.  ASSESSMENT AND PLAN: RLQ ab pain Constipation History of colon polyps Patient presents with RLQ ab pain that has been present for the last couple of months. This is associated with hard stools and seemed to improve after a dose of magnesium citrate. This suggests that his ab pain is most likely due to constipation issues. He is drinking plenty of water and is physically active. Not consuming enough fiber. Will start him on a daily fiber supplement and daily Miralax. He is currently due for a colonoscopy for polyp surveillance. I went over the risks and benefits of the colonoscopy procedure with the patient, and he is agreeable to proceeding. - Start daily fiber supplement - Start daily Miralax - Colonoscopy [facility] with 2-day prep  Estefana Kidney, MD  I spent 45 minutes of time, including in depth chart review, independent review of results as outlined above, communicating results with the patient directly, face-to-face time with the patient, coordinating care, ordering studies and medications as appropriate, and documentation.

## 2023-12-14 NOTE — Patient Instructions (Addendum)
 You have been scheduled for a colonoscopy. Please follow written instructions given to you at your visit today.   If you use inhalers (even only as needed), please bring them with you on the day of your procedure.  DO NOT TAKE 7 DAYS PRIOR TO TEST- Trulicity (dulaglutide) Ozempic, Wegovy (semaglutide) Mounjaro (tirzepatide) Bydureon Bcise (exanatide extended release)  DO NOT TAKE 1 DAY PRIOR TO YOUR TEST Rybelsus (semaglutide) Adlyxin (lixisenatide) Victoza (liraglutide) Byetta (exanatide) ___________________________________________________________________________  We have sent the following medications to your pharmacy for you to pick up at your convenience: Suprep  Start taking daily fiber supplement Benefiber or Metamucil   Start taking Miralax daily _______________________________________________________  If your blood pressure at your visit was 140/90 or greater, please contact your primary care physician to follow up on this.  _______________________________________________________  If you are age 49 or older, your body mass index should be between 23-30. Your Body mass index is 32.57 kg/m. If this is out of the aforementioned range listed, please consider follow up with your Primary Care Provider.  If you are age 61 or younger, your body mass index should be between 19-25. Your Body mass index is 32.57 kg/m. If this is out of the aformentioned range listed, please consider follow up with your Primary Care Provider.   ________________________________________________________  The [facility] providers would like to encourage you to use MYCHART to communicate with providers for non-urgent requests or questions.  Due to long hold times on the telephone, sending your provider a message by [facility] may be a faster and more efficient way to get a response.  Please allow 48 business hours for a response.  Please remember that this is for non-urgent requests.   _______________________________________________________  Due to recent changes in healthcare laws, you may see the results of your imaging and laboratory studies on MyChart before your provider has had a chance to review them.  We understand that in some cases there may be results that are confusing or concerning to you. Not all laboratory results come back in the same time frame and the provider may be waiting for multiple results in order to interpret others.  Please give us  48 hours in order for your provider to thoroughly review all the results before contacting the office for clarification of your results.   Thank you for entrusting me with your care and for choosing [facility],  Dr. Estefana Kidney

## 2024-01-05 ENCOUNTER — Encounter: Payer: Self-pay | Admitting: Internal Medicine

## 2024-01-11 ENCOUNTER — Telehealth: Payer: Self-pay | Admitting: Internal Medicine

## 2024-01-11 NOTE — Telephone Encounter (Signed)
 Inbound call from patient stating he seems to be having some cold symptoms, states he's having headaches and running nose and would like to know if he has to reschedule his upcoming procedure on 8/7   Requesting a call back   Please advise  Thank you

## 2024-01-11 NOTE — Telephone Encounter (Signed)
 Returned pts call.  He states that he has a stuffy head and headache.  He does not have a fever, cough or SOB.  Advised him he is ok to proceed with procedure but should call back if symptoms worsen.

## 2024-01-13 ENCOUNTER — Encounter: Payer: Self-pay | Admitting: Internal Medicine

## 2024-01-13 ENCOUNTER — Ambulatory Visit: Admitting: Internal Medicine

## 2024-01-13 VITALS — BP 141/81 | HR 54 | Temp 98.0°F | Resp 18 | Ht 70.0 in | Wt 227.0 lb

## 2024-01-13 DIAGNOSIS — Z1211 Encounter for screening for malignant neoplasm of colon: Secondary | ICD-10-CM | POA: Diagnosis not present

## 2024-01-13 DIAGNOSIS — D122 Benign neoplasm of ascending colon: Secondary | ICD-10-CM

## 2024-01-13 DIAGNOSIS — K5730 Diverticulosis of large intestine without perforation or abscess without bleeding: Secondary | ICD-10-CM

## 2024-01-13 DIAGNOSIS — Z860100 Personal history of colon polyps, unspecified: Secondary | ICD-10-CM

## 2024-01-13 DIAGNOSIS — D120 Benign neoplasm of cecum: Secondary | ICD-10-CM | POA: Diagnosis not present

## 2024-01-13 DIAGNOSIS — K648 Other hemorrhoids: Secondary | ICD-10-CM

## 2024-01-13 DIAGNOSIS — D123 Benign neoplasm of transverse colon: Secondary | ICD-10-CM

## 2024-01-13 DIAGNOSIS — D124 Benign neoplasm of descending colon: Secondary | ICD-10-CM

## 2024-01-13 MED ORDER — SODIUM CHLORIDE 0.9 % IV SOLN: 500.0000 mL | Freq: Once | INTRAVENOUS | Status: AC

## 2024-01-13 NOTE — Op Note (Addendum)
 [facility] Patient Name: Daniel Frank Procedure Date: 01/13/2024 9:53 AM MRN: [id number] Endoscopist: Rosario Estefana Kidney , , [id number] Age: 73 Referring MD:  Date of Birth: 14-Dec-1950 Gender: Male Account #: [id number] Procedure:                Colonoscopy Indications:              High risk colon cancer surveillance: Personal                            history of colonic polyps Medicines:                Monitored Anesthesia Care Procedure:                Pre-Anesthesia Assessment:                           - Prior to the procedure, a History and Physical                            was performed, and patient medications and                            allergies were reviewed. The patient's tolerance of                            previous anesthesia was also reviewed. The risks                            and benefits of the procedure and the sedation                            options and risks were discussed with the patient.                            All questions were answered, and informed consent                            was obtained. Prior Anticoagulants: The patient has                            taken no anticoagulant or antiplatelet agents. ASA                            Grade Assessment: II - A patient with mild systemic                            disease. After reviewing the risks and benefits,                            the patient was deemed in satisfactory condition to                            undergo the procedure.  After obtaining informed consent, the colonoscope                            was passed under direct vision. Throughout the                            procedure, the patient's blood pressure, pulse, and                            oxygen saturations were monitored continuously. The                            CF [id number] #[id number] was introduced through the anus                            and advanced to the the cecum,  identified by                            appendiceal orifice and ileocecal valve. The                            colonoscopy was performed without difficulty. The                            patient tolerated the procedure well. The quality                            of the bowel preparation was good. The ileocecal                            valve, appendiceal orifice, and rectum were                            photographed. Scope In: 10:03:36 AM Scope Out: 10:33:14 AM Scope Withdrawal Time: 0 hours 23 minutes 57 seconds  Total Procedure Duration: 0 hours 29 minutes 38 seconds  Findings:                 Ten sessile polyps were found in the transverse                            colon, ascending colon and cecum. The polyps were 3                            to 6 mm in size. These polyps were removed with a                            cold snare. Resection and retrieval were complete.                           A 3 mm polyp was found in the descending colon. The                            polyp  was sessile. The polyp was removed with a                            cold snare. Resection and retrieval were complete.                           Multiple diverticula were found in the sigmoid                            colon and descending colon.                           Non-bleeding internal hemorrhoids were found during                            retroflexion. Complications:            No immediate complications. Estimated Blood Loss:     Estimated blood loss was minimal. Impression:               - Ten 3 to 6 mm polyps in the transverse colon, in                            the ascending colon and in the cecum, removed with                            a cold snare. Resected and retrieved.                           - One 3 mm polyp in the descending colon, removed                            with a cold snare. Resected and retrieved.                           - Diverticulosis in the sigmoid colon and in  the                            descending colon.                           - Non-bleeding internal hemorrhoids. Recommendation:           - Discharge patient to home (with escort).                           - Await pathology results.                           - The findings and recommendations were discussed                            with the patient. Dr Estefana Federico Rosario Estefana Federico,  01/13/2024 10:38:53 AM

## 2024-01-13 NOTE — Progress Notes (Signed)
 Pt's states no medical or surgical changes since previsit or office visit.

## 2024-01-13 NOTE — Progress Notes (Signed)
 1025 BP 172/105, Labetalol given IV, MD update, vss

## 2024-01-13 NOTE — Progress Notes (Signed)
 Report given to PACU, vss

## 2024-01-13 NOTE — Patient Instructions (Signed)
Handouts provided on polyps, diverticulosis and hemorrhoids.  Resume previous diet.  Continue present medications.  Await pathology results.   YOU HAD AN ENDOSCOPIC PROCEDURE TODAY AT THE [facility]:   Refer to the procedure report that was given to you for any specific questions about what was found during the examination.  If the procedure report does not answer your questions, please call your gastroenterologist to clarify.  If you requested that your care partner not be given the details of your procedure findings, then the procedure report has been included in a sealed envelope for you to review at your convenience later.  YOU SHOULD EXPECT: Some feelings of bloating in the abdomen. Passage of more gas than usual.  Walking can help get rid of the air that was put into your GI tract during the procedure and reduce the bloating. If you had a lower endoscopy (such as a colonoscopy or flexible sigmoidoscopy) you may notice spotting of blood in your stool or on the toilet paper. If you underwent a bowel prep for your procedure, you may not have a normal bowel movement for a few days.  Please Note:  You might notice some irritation and congestion in your nose or some drainage.  This is from the oxygen used during your procedure.  There is no need for concern and it should clear up in a day or so.  SYMPTOMS TO REPORT IMMEDIATELY:  Following lower endoscopy (colonoscopy or flexible sigmoidoscopy):  Excessive amounts of blood in the stool  Significant tenderness or worsening of abdominal pains  Swelling of the abdomen that is new, acute  Fever of 100F or higher   For urgent or emergent issues, a gastroenterologist can be reached at any hour by calling [phone]. Do not use MyChart messaging for urgent concerns.    DIET:  We do recommend a small meal at first, but then you may proceed to your regular diet.  Drink plenty of fluids but you should avoid alcoholic beverages for 24  hours.  ACTIVITY:  You should plan to take it easy for the rest of today and you should NOT DRIVE or use heavy machinery until tomorrow (because of the sedation medicines used during the test).    FOLLOW UP: Our staff will call the number listed on your records the next business day following your procedure.  We will call around 7:15- 8:00 am to check on you and address any questions or concerns that you may have regarding the information given to you following your procedure. If we do not reach you, we will leave a message.     If any biopsies were taken you will be contacted by phone or by letter within the next 1-3 weeks.  Please call us at [phone] if you have not heard about the biopsies in 3 weeks.    SIGNATURES/CONFIDENTIALITY: You and/or your care partner have signed paperwork which will be entered into your electronic medical record.  These signatures attest to the fact that that the information above on your After Visit Summary has been reviewed and is understood.  Full responsibility of the confidentiality of this discharge information lies with you and/or your care-partner.

## 2024-01-13 NOTE — Progress Notes (Signed)
 Called to room to assist during endoscopic procedure.  Patient ID and intended procedure confirmed with present staff. Received instructions for my participation in the procedure from the performing physician.

## 2024-01-13 NOTE — Progress Notes (Signed)
 1010 BP 163/104, Labetalol given IV, MD update, vss

## 2024-01-13 NOTE — Progress Notes (Signed)
 GASTROENTEROLOGY PROCEDURE H&P NOTE   Primary Care Physician: Beam, Lamar POUR, MD    Reason for Procedure:   History of colon polyps  Plan:    Colonoscopy  Patient is appropriate for endoscopic procedure(s) in the ambulatory ([facility]) setting.  The nature of the procedure, as well as the risks, benefits, and alternatives were carefully and thoroughly reviewed with the patient. Ample time for discussion and questions allowed. The patient understood, was satisfied, and agreed to proceed.     HPI: Daniel Frank is a 73 y.o. male who presents for colonoscopy for evaluation of history of colon polyps.  Patient was most recently seen in the Gastroenterology Clinic on 12/14/23.  No interval change in medical history since that appointment. Please refer to that note for full details regarding GI history and clinical presentation.   Past Medical History:  Diagnosis Date   Arthritis    Degenerative disc disease    Depressive disorder, not elsewhere classified    Eczema    Hyperactivity of bladder    Organic impotence    Other and unspecified hyperlipidemia    Rosacea    Sleep apnea    cpap   Spastic dysuria    Unspecified essential hypertension     Past Surgical History:  Procedure Laterality Date   COLONOSCOPY  09/20/2017    Prior to Admission medications   Medication Sig Start Date End Date Taking? Authorizing Provider  atorvastatin  (LIPITOR) 40 MG tablet Take 1 tablet (40 mg total) by mouth daily. 02/28/23  Yes Stacks, Butler, MD  DULoxetine (CYMBALTA) 60 MG capsule Take 60 mg by mouth daily. 07/08/23  Yes [provider]  Multiple Vitamin (MULTIVITAMIN) tablet Take 1 tablet by mouth daily.   Yes [provider]  olmesartan  (BENICAR ) 40 MG tablet Take 1 tablet (40 mg total) by mouth daily. 02/28/23  Yes Zollie Butler, MD  tamsulosin  (FLOMAX ) 0.4 MG CAPS capsule Take 2 capsules (0.8 mg total) by mouth daily after supper. 02/28/23  Yes Zollie Butler, MD   augmented betamethasone  dipropionate (DIPROLENE -AF) 0.05 % ointment Apply topically daily as needed. 05/04/22   Zollie Butler, MD    Current Outpatient Medications  Medication Sig Dispense Refill   atorvastatin  (LIPITOR) 40 MG tablet Take 1 tablet (40 mg total) by mouth daily. 30 tablet 0   DULoxetine (CYMBALTA) 60 MG capsule Take 60 mg by mouth daily.     Multiple Vitamin (MULTIVITAMIN) tablet Take 1 tablet by mouth daily.     olmesartan  (BENICAR ) 40 MG tablet Take 1 tablet (40 mg total) by mouth daily. 30 tablet 0   tamsulosin  (FLOMAX ) 0.4 MG CAPS capsule Take 2 capsules (0.8 mg total) by mouth daily after supper. 60 capsule 0   augmented betamethasone  dipropionate (DIPROLENE -AF) 0.05 % ointment Apply topically daily as needed. 50 g 10   Current Facility-Administered Medications  Medication Dose Route Frequency Provider Last Rate Last Admin   0.9 %  sodium chloride  infusion  500 mL Intravenous Once Aneita Gwendlyn DASEN, MD       0.9 %  sodium chloride  infusion  500 mL Intravenous Once Gracie Gupta C, MD        Allergies as of 01/13/2024   (No Known Allergies)    Family History  Problem Relation Age of Onset   Heart failure Mother    Chronic bronchitis Mother    Lung disease Father    Uterine cancer Sister    Cancer Sister        uterine  Obesity Sister    Obesity Brother    Colon cancer Neg Hx    Stomach cancer Neg Hx    Esophageal cancer Neg Hx    Pancreatic cancer Neg Hx    Liver disease Neg Hx     Social History   Socioeconomic History   Marital status: Single    Spouse name: Not on file   Number of children: 0   Years of education: Tech sch   Highest education level: Not on file  Occupational History   Occupation: Designer, television/film set: UNEMPLOYED  Tobacco Use   Smoking status: Former    Current packs/day: 0.00    Types: Cigarettes    Quit date: 07/27/2006    Years since quitting: 17.4   Smokeless tobacco: Never  Vaping Use   Vaping status:  Never Used  Substance and Sexual Activity   Alcohol use: Yes    Alcohol/week: 11.0 standard drinks of alcohol    Types: 6 Cans of beer, 5 Shots of liquor per week    Comment: occ   Drug use: No   Sexual activity: Not on file  Other Topics Concern   Not on file  Social History Narrative   Patient lives at home with his mother.  Caffeine  Use: 2 cups of coffee daily and some soda   Social Drivers of Corporate investment banker Strain: Low Risk  (12/01/2023)   Received from [facility]   Overall Financial Resource Strain (CARDIA)    Difficulty of Paying Living Expenses: Not hard at all  Food Insecurity: No Food Insecurity (12/01/2023)   Received from [facility]   Hunger Vital Sign    Within the past 12 months, you worried that your food would run out before you got the money to buy more.: Never true    Within the past 12 months, the food you bought just didn't last and you didn't have money to get more.: Never true  Transportation Needs: No Transportation Needs (12/01/2023)   Received from [facility] - Transportation    Lack of Transportation (Medical): No    Lack of Transportation (Non-Medical): No  Physical Activity: Sufficiently Active (12/01/2023)   Received from [facility]   Exercise Vital Sign    On average, how many days per week do you engage in moderate to strenuous exercise (like a brisk walk)?: 4 days    On average, how many minutes do you engage in exercise at this level?: 50 min  Stress: No Stress Concern Present (12/01/2023)   Received from [facility] - Occupational Stress Questionnaire    Feeling of Stress : Not at all  Social Connections: Moderately Integrated (12/01/2023)   Received from [facility]   Social Network    How would you rate your social network (family, work, friends)?: Adequate participation with social networks  Intimate Partner Violence: Patient Declined (12/01/2023)   Received from  [facility]   HITS    Over the last 12 months how often did your partner physically hurt you?: Patient declined    Over the last 12 months how often did your partner insult you or talk down to you?: Patient declined    Over the last 12 months how often did your partner threaten you with physical harm?: Patient declined    Over the last 12 months how often did your partner scream or curse at you?: Patient declined  Physical Exam: Vital signs in last 24 hours: BP 132/81   Pulse 80   Temp 98 F (36.7 C) (Temporal)   Ht 5' 10 (1.778 m)   Wt 227 lb (103 kg)   SpO2 93%   BMI 32.57 kg/m  GEN: NAD EYE: Sclerae anicteric ENT: MMM CV: Non-tachycardic Pulm: No increased WOB GI: Soft NEURO:  Alert & Oriented   Estefana Kidney, MD [facility]   01/13/2024 9:26 AM

## 2024-01-14 ENCOUNTER — Telehealth: Payer: Self-pay | Admitting: *Deleted

## 2024-01-14 NOTE — Telephone Encounter (Signed)
  Follow up Call-     01/13/2024    9:14 AM  Call back number  Post procedure Call Back phone  # [phone]  Permission to leave phone message Yes     Patient questions:  Do you have a fever, pain , or abdominal swelling? No. Pain Score  0 *  Have you tolerated food without any problems? Yes.    Have you been able to return to your normal activities? Yes.    Do you have any questions about your discharge instructions: Diet   No. Medications  No. Follow up visit  No.  Do you have questions or concerns about your Care? No.  Actions: * If pain score is 4 or above: No action needed, pain <4.

## 2024-01-17 ENCOUNTER — Ambulatory Visit: Payer: Self-pay | Admitting: Internal Medicine

## 2024-01-17 LAB — SURGICAL PATHOLOGY
